# Patient Record
Sex: Female | Born: 1995 | Race: White | Hispanic: No | Marital: Single | Smoking: Former smoker
Health system: Southern US, Community
[De-identification: ages and names within clinical notes are randomized; demographics above are authoritative.]

## PROBLEM LIST (undated history)

## (undated) DIAGNOSIS — F419 Anxiety disorder, unspecified: Secondary | ICD-10-CM

## (undated) DIAGNOSIS — G43909 Migraine, unspecified, not intractable, without status migrainosus: Secondary | ICD-10-CM

## (undated) HISTORY — PX: FRENULECTOMY, LINGUAL: SHX1681

## (undated) HISTORY — PX: BREAST SURGERY: SHX581

---
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
## 2014-01-31 ENCOUNTER — Encounter: Payer: Self-pay | Admitting: Family Medicine

## 2014-01-31 ENCOUNTER — Other Ambulatory Visit: Payer: Self-pay | Admitting: Family Medicine

## 2014-01-31 ENCOUNTER — Ambulatory Visit (INDEPENDENT_AMBULATORY_CARE_PROVIDER_SITE_OTHER): Payer: BC Managed Care – PPO | Admitting: Family Medicine

## 2014-01-31 VITALS — BP 117/76 | HR 83 | Ht 64.0 in | Wt 118.0 lb

## 2014-01-31 DIAGNOSIS — Z113 Encounter for screening for infections with a predominantly sexual mode of transmission: Secondary | ICD-10-CM

## 2014-01-31 DIAGNOSIS — Z00129 Encounter for routine child health examination without abnormal findings: Secondary | ICD-10-CM

## 2014-01-31 DIAGNOSIS — N63 Unspecified lump in unspecified breast: Secondary | ICD-10-CM

## 2014-01-31 DIAGNOSIS — Z0000 Encounter for general adult medical examination without abnormal findings: Secondary | ICD-10-CM

## 2014-01-31 DIAGNOSIS — Z23 Encounter for immunization: Secondary | ICD-10-CM

## 2014-01-31 NOTE — Progress Notes (Signed)
  Subjective:     History was provided by the self.  Rita Graves is a 18 y.o. female who is here for this wellness visit.   Current Issues: Current concerns include:None  H (Home) Family Relationships: good Communication: good with parents Responsibilities: has responsibilities at home  E (Education): Grades: As and Bs School: [employer], freshman in college  Future Plans: college  A (Activities) Sports: no sports Exercise: Yes  Activities: working part time as well Friends: Yes   A (Auton/Safety) Auto: wears seat belt Bike: does not ride Safety: can swim  D (Diet) Diet: balanced diet Risky eating habits: none Intake: adequate iron and calcium intake Body Image: positive body image  Drugs Tobacco: Yes, occ Alcohol: No Drugs: No  Sex Activity: safe sex  Suicide Risk Emotions: healthy Depression: denies feelings of depression Suicidal: denies suicidal ideation     Objective:     Filed Vitals:   01/31/14 1059  BP: 117/76  Pulse: 83  Height: 5\' 4"  (1.626 m)  Weight: 118 lb (53.524 kg)   Growth parameters are noted and are appropriate for age.  General:   alert, cooperative and appears stated age  Gait:   normal  Skin:   normal  Oral cavity:   lips, mucosa, and tongue normal; teeth and gums normal  Eyes:   sclerae white, pupils equal and reactive  Ears:   normal bilaterally  Neck:   normal  Lungs:  clear to auscultation bilaterally  Heart:   regular rate and rhythm, S1, S2 normal, no murmur, click, rub or gallop  Abdomen:  soft, non-tender; bowel sounds normal; no masses,  no organomegaly  GU:  not examined  Extremities:   extremities normal, atraumatic, no cyanosis or edema  Neuro:  normal without focal findings, mental status, speech normal, alert and oriented x3, PERLA and reflexes normal and symmetric     Breast exam - normal left breast. On the right breast at the 10:00 position there is approximately 1 x 3 cm oval-shaped firm but mobile  nodule right at the edge of the area low. Just distal to this again at the 10:00 position on the right breast there is a 4 x 6 cm firm but mobile nodule.  Assessment:    Healthy 18 y.o. female child.    Plan:   1. Anticipatory guidance discussed. Nutrition, Physical activity, Behavior, Safety and Handout given  2. Follow-up visit in 12 months for next wellness visit, or sooner as needed.   3. Given Gardasil vaccine IM today.   4. Breast lump-refer for ultrasound. She had an ultrasound about a year ago and was told that the lesion in her breast was benign but she does like it's gotten larger so I think it's worth repeating the ultrasound this year.  5. Declines flu vaccine this year.   6. She is sexually active. Will do urine GC and chlaymdia.

## 2014-01-31 NOTE — Addendum Note (Signed)
Addended by: Teddy Spike on: 01/31/2014 12:31 PM   Modules accepted: Orders

## 2014-01-31 NOTE — Patient Instructions (Signed)
Keep up a regular exercise program and make sure you are eating a healthy diet Try to eat 4 servings of dairy a day, or if you are lactose intolerant take a calcium with vitamin D daily.  Your vaccines are up to date.   

## 2014-02-01 LAB — GC/CHLAMYDIA PROBE AMP
CT Probe RNA: NEGATIVE
GC PROBE AMP APTIMA: NEGATIVE

## 2014-02-03 NOTE — Addendum Note (Signed)
Addended by: Narda Rutherford on: 02/03/2014 03:31 PM   Modules accepted: Orders

## 2014-02-21 ENCOUNTER — Other Ambulatory Visit: Payer: Self-pay | Admitting: *Deleted

## 2014-02-21 DIAGNOSIS — N6019 Diffuse cystic mastopathy of unspecified breast: Secondary | ICD-10-CM

## 2014-02-24 ENCOUNTER — Other Ambulatory Visit: Payer: Self-pay | Admitting: Family Medicine

## 2014-02-24 DIAGNOSIS — N63 Unspecified lump in unspecified breast: Secondary | ICD-10-CM

## 2014-03-07 ENCOUNTER — Ambulatory Visit: Payer: BC Managed Care – PPO | Admitting: Family Medicine

## 2014-03-09 ENCOUNTER — Other Ambulatory Visit: Payer: Self-pay | Admitting: Family Medicine

## 2014-03-18 ENCOUNTER — Ambulatory Visit: Payer: BC Managed Care – PPO | Admitting: Family Medicine

## 2014-03-21 ENCOUNTER — Ambulatory Visit: Payer: BC Managed Care – PPO | Admitting: Family Medicine

## 2014-03-24 ENCOUNTER — Encounter: Payer: Self-pay | Admitting: Family Medicine

## 2014-03-24 ENCOUNTER — Ambulatory Visit (INDEPENDENT_AMBULATORY_CARE_PROVIDER_SITE_OTHER): Payer: BC Managed Care – PPO | Admitting: Family Medicine

## 2014-03-24 VITALS — BP 117/72 | HR 91 | Ht 64.0 in | Wt 127.0 lb

## 2014-03-24 DIAGNOSIS — B354 Tinea corporis: Secondary | ICD-10-CM

## 2014-03-24 DIAGNOSIS — R21 Rash and other nonspecific skin eruption: Secondary | ICD-10-CM

## 2014-03-24 MED ORDER — CLOTRIMAZOLE 1 % EX CREA: 1.0000 "application " | TOPICAL_CREAM | Freq: Two times a day (BID) | CUTANEOUS | Status: DC

## 2014-03-24 NOTE — Progress Notes (Signed)
   Subjective:    Patient ID: Rita Graves, female    DOB: Nov 01, 1995, 18 y.o.   MRN: [id number]  HPI Thinks may have ringworm on the right inner thigh. She noticed it a couple of weeks ago it is mildly itchy. If not any treatments. She's not had any animals but has been around some friends who do have animals.   she also noticed some welts on her left buttock cheek about a week ago. [place] Thought maybe it was a mosquito bite Judithe Modest. They were extremely itchy. They seem to be healing on her own.  Review of Systems     Objective:   Physical Exam Right inner thigh has an approx 4 cm circular region, classic for ringworm. She has a smaller erythematous 1 cm lesion is lateral to this.  The area on her left buttocks appears to be healing. Just some dry scaling skin.       Assessment & Plan:  Ringworm-gave reassurance. We'll treat with clotrimazole cream. Asked her to treat for a full week after the lesion clears.  The lesions on left buttock cheek appeared to be healing well. Unclear if this was from an insect bite. Certainly if it recurs in the exact same area then consider other diagnoses such as herpes simplex.

## 2014-03-24 NOTE — Addendum Note (Signed)
Addended by: Teddy Spike on: 03/24/2014 11:06 AM   Modules accepted: Orders

## 2014-03-24 NOTE — Patient Instructions (Signed)
Body Ringworm Ringworm (tinea corporis) is a fungal infection of the skin on the body. This infection is not caused by worms, but is actually caused by a fungus. Fungus normally lives on the top of your skin and can be useful. However, in the case of ringworms, the fungus grows out of control and causes a skin infection. It can involve any area of skin on the body and can spread easily from one person to another (contagious). Ringworm is a common problem for children, but it can affect adults as well. Ringworm is also often found in athletes, especially wrestlers who share equipment and mats.  CAUSES  Ringworm of the body is caused by a fungus called dermatophyte. It can spread by:  Touchingother people who are infected.  Touchinginfected pets.  Touching or sharingobjects that have been in contact with the infected person or pet (hats, combs, towels, clothing, sports equipment). SYMPTOMS   Itchy, raised red spots and bumps on the skin.  Ring-shaped rash.  Redness near the border of the rash with a clear center.  Dry and scaly skin on or around the rash. Not every person develops a ring-shaped rash. Some develop only the red, scaly patches. DIAGNOSIS  Most often, ringworm can be diagnosed by performing a skin exam. Your caregiver may choose to take a skin scraping from the affected area. The sample will be examined under the microscope to see if the fungus is present.  TREATMENT  Body ringworm may be treated with a topical antifungal cream or ointment. Sometimes, an antifungal shampoo that can be used on your body is prescribed. You may be prescribed antifungal medicines to take by mouth if your ringworm is severe, keeps coming back, or lasts a long time.  HOME CARE INSTRUCTIONS   Only take over-the-counter or prescription medicines as directed by your caregiver.  Wash the infected area and dry it completely before applying yourcream or ointment.  When using antifungal shampoo to  treat the ringworm, leave the shampoo on the body for 3-5 minutes before rinsing.   Wear loose clothing to stop clothes from rubbing and irritating the rash.  Wash or change your bed sheets every night while you have the rash.  Have your pet treated by your veterinarian if it has the same infection. To prevent ringworm:   Practice good hygiene.  Wear sandals or shoes in public places and showers.  Do not share personal items with others.  Avoid touching red patches of skin on other people.  Avoid touching pets that have bald spots or wash your hands after doing so. SEEK MEDICAL CARE IF:   Your rash continues to spread after 7 days of treatment.  Your rash is not gone in 4 weeks.  The area around your rash becomes red, warm, tender, and swollen. Document Released: 05/20/2000 Document Revised: 02/15/2012 Document Reviewed: 12/05/2011 [facility] Patient Information 2015 [place], [place]. This information is not intended to replace advice given to you by your health care provider. Make sure you discuss any questions you have with your health care provider. Clotrimazole skin cream, lotion, or solution What is this medicine? CLOTRIMAZOLE (kloe TRIM a zole) is an antifungal medicine. It is used to treat certain kinds of fungal or yeast infections of the skin. This medicine may be used for other purposes; ask your health care provider or pharmacist if you have questions. COMMON BRAND NAME(S): Anti-Fungal, Antifungal, Cruex, Desenex, Fungoid, Lotrimin, Lotrimin AF, Lotrimin AF Ringworm What should I tell my health care provider before  I take this medicine? They need to know if you have any of these conditions: -an unusual or allergic reaction to clotrimazole, other antifungals or medicines, foods, dyes or preservatives -pregnant or trying to get pregnant -breast-feeding How should I use this medicine? This medicine is for external use only. Do not take by mouth. Follow the directions on  the prescription label. Wash your hands before and after use. If treating a hand or nail infection, wash hands before use only. Apply a thin layer to the affected area and a small amount to the surrounding area. Rub in gently. Do not get this medicine in your eyes. If you do, rinse out with plenty of cool tap water. Use this medicine at regular intervals. Do not use more often than directed. Finish the full course prescribed by your doctor or health care professional even if you think you are better. Do not stop using except on your doctor's advice. Talk to your pediatrician regarding the use of this medicine in children. While this drug has been used in young children for selected conditions, precautions do apply. Overdosage: If you think you have taken too much of this medicine contact a poison control center or emergency room at once. NOTE: This medicine is only for you. Do not share this medicine with others. What if I miss a dose? If you miss a dose, use it as soon as you can. If it is almost time for your next dose, use only that dose. Do not use double or extra doses. What may interact with this medicine? -amphotericin b -topical products that have nystatin This list may not describe all possible interactions. Give your health care provider a list of all the medicines, herbs, non-prescription drugs, or dietary supplements you use. Also tell them if you smoke, drink alcohol, or use illegal drugs. Some items may interact with your medicine. What should I watch for while using this medicine? Tell your doctor or health care professional if your symptoms do not start to improve after 7 days. Do not self-medicate for more than one week. If you are using this medicine for 'jock itch' be sure to dry the groin completely after bathing. Do not wear underwear that is tight-fitting or made from synthetic fibers like rayon or nylon. Wear loose-fitting, cotton underwear. If you are using this medicine for  athlete's foot be sure to dry your feet carefully after bathing, especially between the toes. Do not wear socks made from wool or synthetic materials like rayon or nylon. Wear clean cotton socks and change them at least once a day, change them more if your feet sweat a lot. Also, try to wear sandals or shoes that are well-ventilated. What side effects may I notice from receiving this medicine? Side effects that usually do not require medical attention (report to your doctor or health care professional if they continue or are bothersome): -allergic reactions like skin rash, itching or hives, swelling of the face, lips, or tongue -skin irritation, burning This list may not describe all possible side effects. Call your doctor for medical advice about side effects. You may report side effects to FDA at 1-800-FDA-1088. Where should I keep my medicine? Keep out of the reach of children. Store at room temperature between 2 to 30 degrees C (36 to 86 degrees F). Do not freeze. Throw away any unused medicine after the expiration date. NOTE: This sheet is a summary. It may not cover all possible information. If you have questions about this medicine,  talk to your doctor, pharmacist, or health care provider.  2015, Elsevier/Gold Standard. (2007-08-27 16:53:51)

## 2014-03-25 LAB — KOH PREP: RESULT - KOH: NONE SEEN

## 2014-04-02 ENCOUNTER — Encounter: Payer: Self-pay | Admitting: Family Medicine

## 2014-04-02 ENCOUNTER — Ambulatory Visit: Payer: BC Managed Care – PPO

## 2014-04-08 ENCOUNTER — Emergency Department (INDEPENDENT_AMBULATORY_CARE_PROVIDER_SITE_OTHER)
Admission: EM | Admit: 2014-04-08 | Discharge: 2014-04-08 | Disposition: A | Discharge status: Home / Self Care | Payer: BC Managed Care – PPO | Source: Home / Self Care | Attending: Emergency Medicine | Admitting: Emergency Medicine

## 2014-04-08 ENCOUNTER — Encounter: Payer: Self-pay | Admitting: *Deleted

## 2014-04-08 DIAGNOSIS — N912 Amenorrhea, unspecified: Secondary | ICD-10-CM

## 2014-04-08 DIAGNOSIS — G43101 Migraine with aura, not intractable, with status migrainosus: Secondary | ICD-10-CM

## 2014-04-08 HISTORY — DX: Anxiety disorder, unspecified: F41.9

## 2014-04-08 HISTORY — DX: Migraine, unspecified, not intractable, without status migrainosus: G43.909

## 2014-04-08 LAB — POCT URINE PREGNANCY: Preg Test, Ur: NEGATIVE

## 2014-04-08 MED ORDER — HYDROCODONE-ACETAMINOPHEN 5-325 MG PO TABS: 1.0000 | ORAL_TABLET | ORAL | Status: DC | PRN

## 2014-04-08 MED ORDER — KETOROLAC TROMETHAMINE 60 MG/2ML IM SOLN
60.0000 mg | Freq: Once | INTRAMUSCULAR | Status: AC
  Administered 2014-04-08: 60 mg via INTRAMUSCULAR

## 2014-04-08 MED ORDER — METHYLPREDNISOLONE SODIUM SUCC 125 MG IJ SOLR
125.0000 mg | INTRAMUSCULAR | Status: AC
  Administered 2014-04-08: 125 mg via INTRAMUSCULAR

## 2014-04-08 NOTE — ED Notes (Signed)
Rita Graves reports having Aura with blurry vision followed by a migraine x 2 days. She took Imitrex 2 days ago, did not like the way it made her feel. she reports it helped the migraine a little. She reports that her wisdom teeth are coming in and she is not sure if this is related. She has had migraines x 2 months. She has not seen a neurologist.

## 2014-04-08 NOTE — ED Provider Notes (Signed)
CSN: [id number]     Arrival date & time 04/08/14  1230 History   First MD Initiated Contact with Patient 04/08/14 1308     Chief Complaint  Patient presents with  . Migraine  . Blurred Vision   (Consider location/radiation/quality/duration/timing/severity/associated sxs/prior Treatment) HPI Iris reports having Aura with blurry vision followed by a migraine x 2 days.  Right parietal/occipital headache is sharp, without radiation. 5/10 intensity. Sleep helps somewhat. She tried her father's oral Imitrex, it may have helped minimally, however she did not like the way it made her feel, as she felt the Imitrex made her feel weak and shaky.  she reports Tylenol/caffeine OTC helped the migraine a little. She reports that her wisdom teeth are coming in and she is not sure if this is related.  She states that she thinks that she's had migraines for three months or more, but has never been evaluated by a neurologist for this. She feels it's a migraine, as it's similar to migraines that her father has. She denies any focal neurologic symptoms other than occasional blurry vision. Denies double vision. Denies any other ENT symptoms. No fever or chills or nausea or vomiting. She denies any abdominal or GYN symptoms, and I questioned her about last menstrual period was approximately October 6. She states she is on an oral contraceptive, but she has had unprotected sex, and she requests that we do a urine pregnancy test today.  Past Medical History  Diagnosis Date  . Anxiety   . Migraine    Past Surgical History  Procedure Laterality Date  . Frenulectomy, lingual    . Breast surgery Right     biopsy   Family History  Problem Relation Age of Onset  . Diabetes Father   . Migraines Father   . Hypertension    . Thyroid disease    . Asthma     History  Substance Use Topics  . Smoking status: Current Every Day Smoker -- 0.50 packs/day  . Smokeless tobacco: Not on file  . Alcohol Use: No   OB  History    No data available     Review of Systems  Eyes: Positive for photophobia.  Neurological: Positive for headaches. Negative for syncope and numbness.  All other systems reviewed and are negative.   Allergies  Review of patient's allergies indicates no known allergies.  Home Medications   Prior to Admission medications   Medication Sig Start Date End Date Taking? Authorizing Provider  clonazePAM (KLONOPIN) 0.5 MG tablet Take 0.5 mg by mouth 2 (two) times daily as needed for anxiety.   Yes Historical Provider, MD  ALPRAZolam Duanne Moron) 0.5 MG tablet Take 1 tablet (0.5 mg total) by mouth 2 (two) times daily as needed for anxiety. 11/28/13   Hali Marry, MD  cetirizine (ZYRTEC) 10 MG tablet Take 10 mg by mouth daily.    Historical Provider, MD  citalopram (CELEXA) 40 MG tablet TAKE 1 TABLET (40 MG TOTAL) BY MOUTH DAILY. 12/26/13   Hali Marry, MD  clotrimazole (LOTRIMIN) 1 % cream Apply 1 application topically 2 (two) times daily. 03/24/14   Hali Marry, MD  desogestrel-ethinyl estradiol (KARIVA,AZURETTE,MIRCETTE) 0.15-0.02/0.01 MG (21/5) tablet Take 1 tablet by mouth daily. 12/26/13   Hali Marry, MD  HYDROcodone-acetaminophen (NORCO/VICODIN) 5-325 MG per tablet Take 1-2 tablets by mouth every 4 (four) hours as needed for severe pain. Take with food. 04/08/14   Jacqulyn Cane, MD   BP 108/69 mmHg  Pulse 94  Temp(Src) 99 F (37.2 C) (Oral)  Resp 16  Ht 5\' 3"  (1.6 m)  Wt 128 lb (58.06 kg)  BMI 22.68 kg/m2  SpO2 99%  LMP 03/11/2014 Physical Exam  Constitutional: She appears well-developed and well-nourished. She appears distressed (Uncomfortable from headache.).  +photophobic  HENT:  Head: Normocephalic and atraumatic. Head is without raccoon's eyes, without contusion, without right periorbital erythema and without left periorbital erythema.  Right Ear: External ear and ear canal normal. No drainage. Tympanic membrane is not erythematous.  Left  Ear: External ear and ear canal normal. No drainage. Tympanic membrane is not erythematous.  Nose: Nose normal. Right sinus exhibits no maxillary sinus tenderness. Left sinus exhibits no maxillary sinus tenderness.  Mouth/Throat: Oropharynx is clear and moist and mucous membranes are normal. No oral lesions. No dental abscesses. No oropharyngeal exudate, posterior oropharyngeal edema or posterior oropharyngeal erythema.      No cranial bruits.  Normal temporal arteries.  Eyes: Conjunctivae, EOM and lids are normal. Pupils are equal, round, and reactive to light.  Fundoscopic exam:      The right eye shows no exudate, no hemorrhage and no papilledema.       The left eye shows no exudate, no hemorrhage and no papilledema.  Neck: Neck supple. Normal carotid pulses and no JVD present. Carotid bruit is not present. No tracheal deviation present. No thyromegaly present.  Cardiovascular: Regular rhythm and normal heart sounds.   Pulmonary/Chest: Effort normal and breath sounds normal. No respiratory distress. She has no wheezes. She has no rales.  Abdominal: Soft. There is no tenderness.  Musculoskeletal: She exhibits no edema or tenderness.  Neurological: She is alert. She has normal strength and normal reflexes. No cranial nerve deficit or sensory deficit. Coordination normal.  Reflex Scores:      Tricep reflexes are 2+ on the right side and 2+ on the left side.      Bicep reflexes are 2+ on the right side and 2+ on the left side.      Brachioradialis reflexes are 2+ on the right side and 2+ on the left side.      Patellar reflexes are 2+ on the right side and 2+ on the left side.      Achilles reflexes are 2+ on the right side and 2+ on the left side. Skin: Skin is warm and dry. No rash noted.  Psychiatric: She has a normal mood and affect.  Nursing note and vitals reviewed.  ENT exam otherwise negative without sign of infection. She does have erupting wisdom teeth. No sign of dental  abscess. No signs of acute sinusitis ED Course  Procedures (including critical care time) Labs Review Labs Reviewed  POCT URINE PREGNANCY   Results for orders placed or performed during the hospital encounter of 04/08/14  POCT urine pregnancy  Result Value Ref Range   Preg Test, Ur Negative   I informed patient of negative urine pregnancy test.   Imaging Review No results found.   MDM   1. Migraine with aura and with status migrainosus, not intractable   2. Amenorrhea    Over 30 minutes spent, greater than 50% of the time spent for counseling and coordination of care. Discussed migraines. Explained there's no evidence clinically of any acute intracranial event. She declined any imaging tests today. Treatment options discussed, as well as risks, benefits, alternatives. Patient voiced understanding and agreement with the following plans: Toradol 60 mg IM SoluMedrol 125 mg IM Follow up with dentist this week  regarding her erupting wisdom teeth. Follow-up with neurologist within 7 days, ER if symptoms become worse or any red flag. Precautions discussed. Red flags discussed. Questions invited and answered. Patient voiced understanding and agreement.      Jacqulyn Cane, MD 04/08/14 [phone]

## 2014-04-28 ENCOUNTER — Ambulatory Visit: Payer: BC Managed Care – PPO | Admitting: Family Medicine

## 2014-04-28 DIAGNOSIS — Z0289 Encounter for other administrative examinations: Secondary | ICD-10-CM

## 2014-05-22 ENCOUNTER — Other Ambulatory Visit: Payer: Self-pay | Admitting: *Deleted

## 2014-05-22 MED ORDER — ALPRAZOLAM 0.5 MG PO TABS: 0.5000 mg | ORAL_TABLET | Freq: Two times a day (BID) | ORAL | Status: DC | PRN

## 2014-06-02 ENCOUNTER — Ambulatory Visit: Payer: BC Managed Care – PPO | Admitting: Family Medicine

## 2014-06-02 DIAGNOSIS — Z0289 Encounter for other administrative examinations: Secondary | ICD-10-CM

## 2014-06-09 ENCOUNTER — Ambulatory Visit: Payer: BC Managed Care – PPO | Admitting: Family Medicine

## 2014-06-13 ENCOUNTER — Encounter: Payer: Self-pay | Admitting: Family Medicine

## 2014-06-13 ENCOUNTER — Ambulatory Visit (INDEPENDENT_AMBULATORY_CARE_PROVIDER_SITE_OTHER): Payer: BLUE CROSS/BLUE SHIELD

## 2014-06-13 ENCOUNTER — Other Ambulatory Visit: Payer: Self-pay | Admitting: Family Medicine

## 2014-06-13 ENCOUNTER — Ambulatory Visit (INDEPENDENT_AMBULATORY_CARE_PROVIDER_SITE_OTHER): Payer: BLUE CROSS/BLUE SHIELD | Admitting: Family Medicine

## 2014-06-13 VITALS — BP 120/70 | HR 94 | Ht 64.0 in | Wt 136.0 lb

## 2014-06-13 DIAGNOSIS — M545 Low back pain, unspecified: Secondary | ICD-10-CM

## 2014-06-13 DIAGNOSIS — R52 Pain, unspecified: Secondary | ICD-10-CM

## 2014-06-13 DIAGNOSIS — M549 Dorsalgia, unspecified: Secondary | ICD-10-CM

## 2014-06-13 DIAGNOSIS — R638 Other symptoms and signs concerning food and fluid intake: Secondary | ICD-10-CM

## 2014-06-13 DIAGNOSIS — G9589 Other specified diseases of spinal cord: Secondary | ICD-10-CM

## 2014-06-13 DIAGNOSIS — F3281 Premenstrual dysphoric disorder: Secondary | ICD-10-CM

## 2014-06-13 DIAGNOSIS — N943 Premenstrual tension syndrome: Secondary | ICD-10-CM

## 2014-06-13 DIAGNOSIS — M542 Cervicalgia: Secondary | ICD-10-CM

## 2014-06-13 DIAGNOSIS — M546 Pain in thoracic spine: Secondary | ICD-10-CM

## 2014-06-13 MED ORDER — MELOXICAM 15 MG PO TABS: 15.0000 mg | ORAL_TABLET | Freq: Every day | ORAL | Status: DC

## 2014-06-13 MED ORDER — NORETHINDRON-ETHINYL ESTRAD-FE 1-20/1-30/1-35 MG-MCG PO TABS: 1.0000 | ORAL_TABLET | Freq: Every day | ORAL | Status: DC

## 2014-06-13 NOTE — Progress Notes (Signed)
   Subjective:    Patient ID: Rita Graves, female    DOB: Sep 22, 1995, 19 y.o.   MRN: [id number]  HPI  she is here to follow-up on low back pain that she has had for 6 months. She was doing more heavy lifting at work over the summer.. She reports that it's mostly in the center of her back and occasionally radiates up. She rates her pain is 5 out of 10. She's been doing stretching, taking hot showers, and using ibuprofen and heat pad. She would describe her pain as a dull ache. She was given hydrocodone for migraine and says it did seem to provide some relief for her back pain. She stands for prolonged periods as she is a Scientist, water quality at [employer] and that seems to aggravate her symptoms. She feels like her pain is constant. She says a couple of months ago she slipped on the stair.  Wake up stiff in the AM.  Says says hunched over until feels loosening up. Takes aabout 1/2 hour to loosen up.    Craving of orange juice.    She wonders if this could be a sign of something like iron deficiency anemia. She says it's been going on intermittently for years.  Very emotional around her periods the last 3 cycles.   She's been on her current birth control pill for quite some time and says it works well for her to control her periods but she is not happy with the emotional lability over the last several months. It usually is focused around the time of her..  Review of Systems     Objective:   Physical Exam  Constitutional: She is oriented to person, place, and time. She appears well-developed and well-nourished.  HENT:  Head: Normocephalic and atraumatic.  Musculoskeletal:  Neck with normal flexion, extension, rotation right and left and side bending. Shoulders with normal range of motion. Strength in the upper and lower chin these is 5 out of 5 and symmetric. She does have some tenderness over the left upper thoracic paraspinous muscles. She does have some tenderness over the lower thoracic spine.  Nontender over the SI joints or paraspinous muscles over the lumbar spine.  Neurological: She is alert and oriented to person, place, and time.  Skin: Skin is warm and dry.  Psychiatric: She has a normal mood and affect.          Assessment & Plan:   chronic back pain 6 months of the thoracic and lumbar spine-concerning for other etiology such as ankylosing spondylitis. We'll check a sedimentation rate, CRP, CBC as well as an HLA-B27. We'll also get x-rays. Again is unusual for 19 year old without any significant trauma or injury to have persistent back pain. If everything looks pretty normal then we will put her into physical therapy. She can stop her ibuprofen and will switch her to meloxicam. Make sure take with water and stop the medial if any GI upset or irritation.  Craving orange juice-this is a little unusual but we will check her for anemia.  PMDD-we will try switching to a different birth control pill to see if this makes a difference. If it doesn't we may need to consider a low-dose SSRI around the time of her menstrual cycle. Also encouraged her to make sure that she's exercising, eating healthy, and getting plenty of rest at night.

## 2014-06-14 LAB — CBC WITH DIFFERENTIAL/PLATELET
BASOS ABS: 0 10*3/uL (ref 0.0–0.1)
Basophils Relative: 0 % (ref 0–1)
Eosinophils Absolute: 0.4 10*3/uL (ref 0.0–0.7)
Eosinophils Relative: 6 % — ABNORMAL HIGH (ref 0–5)
HCT: 37.8 % (ref 36.0–46.0)
Hemoglobin: 12.7 g/dL (ref 12.0–15.0)
LYMPHS PCT: 31 % (ref 12–46)
Lymphs Abs: 2.2 10*3/uL (ref 0.7–4.0)
MCH: 30.2 pg (ref 26.0–34.0)
MCHC: 33.6 g/dL (ref 30.0–36.0)
MCV: 90 fL (ref 78.0–100.0)
MONOS PCT: 12 % (ref 3–12)
MPV: 9.2 fL (ref 8.6–12.4)
Monocytes Absolute: 0.8 10*3/uL (ref 0.1–1.0)
NEUTROS ABS: 3.6 10*3/uL (ref 1.7–7.7)
Neutrophils Relative %: 51 % (ref 43–77)
PLATELETS: 267 10*3/uL (ref 150–400)
RBC: 4.2 MIL/uL (ref 3.87–5.11)
RDW: 12.3 % (ref 11.5–15.5)
WBC: 7 10*3/uL (ref 4.0–10.5)

## 2014-06-14 LAB — C-REACTIVE PROTEIN: CRP: <0.5 mg/dL (ref <0.60)

## 2014-06-14 LAB — SEDIMENTATION RATE: Sed Rate: 4 mm/hr (ref 0–22)

## 2014-06-14 LAB — FERRITIN: Ferritin: 23 ng/mL (ref 10–291)

## 2014-06-14 LAB — FOLATE

## 2014-06-14 LAB — VITAMIN B12: Vitamin B-12: 723 pg/mL (ref 211–911)

## 2014-06-17 LAB — HLA-B27 ANTIGEN: DNA RESULT: NOT DETECTED

## 2014-06-18 ENCOUNTER — Other Ambulatory Visit: Payer: Self-pay | Admitting: Family Medicine

## 2014-06-18 DIAGNOSIS — M545 Low back pain, unspecified: Secondary | ICD-10-CM

## 2014-06-18 DIAGNOSIS — M546 Pain in thoracic spine: Secondary | ICD-10-CM

## 2014-06-30 ENCOUNTER — Ambulatory Visit: Payer: BLUE CROSS/BLUE SHIELD | Admitting: Physical Therapy

## 2014-07-22 ENCOUNTER — Ambulatory Visit: Payer: BLUE CROSS/BLUE SHIELD | Admitting: Physical Therapy

## 2014-07-25 ENCOUNTER — Ambulatory Visit: Payer: BLUE CROSS/BLUE SHIELD | Admitting: Family Medicine

## 2014-08-04 ENCOUNTER — Ambulatory Visit: Payer: BLUE CROSS/BLUE SHIELD | Admitting: Family Medicine

## 2014-08-14 ENCOUNTER — Encounter: Payer: Self-pay | Admitting: Family Medicine

## 2014-08-14 ENCOUNTER — Ambulatory Visit (INDEPENDENT_AMBULATORY_CARE_PROVIDER_SITE_OTHER): Payer: Medicaid Other | Admitting: Family Medicine

## 2014-08-14 VITALS — BP 129/75 | HR 96 | Ht 64.01 in | Wt 136.0 lb

## 2014-08-14 DIAGNOSIS — M545 Low back pain, unspecified: Secondary | ICD-10-CM

## 2014-08-14 DIAGNOSIS — M549 Dorsalgia, unspecified: Secondary | ICD-10-CM | POA: Insufficient documentation

## 2014-08-14 DIAGNOSIS — F3281 Premenstrual dysphoric disorder: Secondary | ICD-10-CM | POA: Insufficient documentation

## 2014-08-14 DIAGNOSIS — N943 Premenstrual tension syndrome: Secondary | ICD-10-CM

## 2014-08-14 DIAGNOSIS — M546 Pain in thoracic spine: Secondary | ICD-10-CM | POA: Diagnosis not present

## 2014-08-14 DIAGNOSIS — D241 Benign neoplasm of right breast: Secondary | ICD-10-CM

## 2014-08-14 DIAGNOSIS — F411 Generalized anxiety disorder: Secondary | ICD-10-CM

## 2014-08-14 DIAGNOSIS — N6310 Unspecified lump in the right breast, unspecified quadrant: Secondary | ICD-10-CM

## 2014-08-14 DIAGNOSIS — N63 Unspecified lump in breast: Secondary | ICD-10-CM

## 2014-08-14 NOTE — Progress Notes (Addendum)
Subjective:    Patient ID: Rita Graves, female    DOB: 01/22/1996, 19 y.o.   MRN: [id number]  HPI  Follow-up upper back/lumbar spine pain-1 I last saw her in January, approximately 2 months ago she was having persistent back pain that was dull and achy. She had negative x-ray films as well as normal blood work to rule out autoimmune causes. Referred her to physical therapy.Meloxicam helped at first. She hasn't been able to go to PT bc she lost her job.   PMDD-we decided to switch her birth control 2 months ago just to see if this would help. Also on 40mg  of citalopram.    She says she has felt a new cyst on the right breast. It's little bit more medial compared to the one that we previously had ultrasounded and had biopsied.  Review of Systems  BP 129/75 mmHg  Pulse 96  Ht 5' 4.01" (1.626 m)  Wt 136 lb (61.689 kg)  BMI 23.33 kg/m2    No Known Allergies  Past Medical History  Diagnosis Date  . Anxiety   . Migraine     Past Surgical History  Procedure Laterality Date  . Frenulectomy, lingual    . Breast surgery Right     biopsy    History   Social History  . Marital Status: Single    Spouse Name: N/A  . Number of Children: N/A  . Years of Education: N/A   Occupational History  . Not on file.   Social History Main Topics  . Smoking status: Current Some Day Smoker -- 0.50 packs/day  . Smokeless tobacco: Not on file  . Alcohol Use: No  . Drug Use: No  . Sexual Activity: Not on file   Other Topics Concern  . Not on file   Social History Narrative   Currently in 11th grade.  Early college at [employer].      Family History  Problem Relation Age of Onset  . Diabetes Father   . Migraines Father   . Hypertension    . Thyroid disease    . Asthma      Outpatient Encounter Prescriptions as of 08/14/2014  Medication Sig  . ALPRAZolam (XANAX) 0.5 MG tablet Take 1 tablet (0.5 mg total) by mouth 2 (two) times daily as needed for anxiety.  . citalopram (CELEXA)  40 MG tablet TAKE 1 TABLET (40 MG TOTAL) BY MOUTH DAILY.  . meloxicam (MOBIC) 15 MG tablet Take 1 tablet (15 mg total) by mouth daily.  . norethindrone-ethinyl estradiol-iron (ESTROSTEP FE,TILIA FE,TRI-LEGEST FE) 1-20/1-30/1-35 MG-MCG tablet Take 1 tablet by mouth daily.  . clonazePAM (KLONOPIN) 0.5 MG tablet Take 0.5 mg by mouth 2 (two) times daily as needed for anxiety.          Objective:   Physical Exam  Pulmonary/Chest:    she has a new lesion that is approximately 2.5 x 2 cm in the right lower quadrant medial to the nipple in approximately 5:00 position. 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  Musculoskeletal:  mildy tender over the mid thoracic spine.  Neck with normal flexion, extension, rotation right and left, side bending. Child[place] with normal range of motion. Upper enlarged may strength is 5 out of 5 and patellar reflexes are 2+.          Assessment & Plan:  Upper back pain/lumbar Pain -she is not going to be able to afford physical therapy recommend  a trial of home PT. Will give her handout on some exercises and stretches and encouraged her to start a walking program. If she's not improving over the next month or 2 then consider formal physical therapy if at that time she's able to afford it.   PMDD - doing a little bit better on the new birth control pill but had a regular cycle. Encouraged her to stand for 1 more month. If cycle does not regulate by the end of the next pill pack then consider switching again.   Generalized anxiety disorder-Still on citalopram. She feels like overall it's working well. Her PHQ 9 score is 16 today.  GAD- 7 score of 16 today as well.   Breast cyst on the right breast-she has a new lesion that is approximately 2.5 x 2 cm in the right lower quadrant medial to the nipple in approximately 5:00 position. 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 Will call the breast clinic in [facility] to see where she is on track. She had a biopsy 6 months ago.

## 2014-08-14 NOTE — Addendum Note (Signed)
Addended by: Beatrice Lecher D on: 08/14/2014 05:46 PM   Modules accepted: Orders

## 2014-08-21 ENCOUNTER — Telehealth: Payer: Self-pay | Admitting: *Deleted

## 2014-08-21 DIAGNOSIS — D241 Benign neoplasm of right breast: Secondary | ICD-10-CM

## 2014-08-21 DIAGNOSIS — N63 Unspecified lump in unspecified breast: Secondary | ICD-10-CM

## 2014-08-21 NOTE — Telephone Encounter (Signed)
Order placed & faxed to [phone].Rita Graves, Rita Graves

## 2014-08-22 ENCOUNTER — Other Ambulatory Visit: Payer: Self-pay | Admitting: *Deleted

## 2014-08-22 DIAGNOSIS — D241 Benign neoplasm of right breast: Secondary | ICD-10-CM

## 2014-08-22 DIAGNOSIS — N63 Unspecified lump in unspecified breast: Secondary | ICD-10-CM

## 2014-08-25 ENCOUNTER — Ambulatory Visit (INDEPENDENT_AMBULATORY_CARE_PROVIDER_SITE_OTHER): Payer: Medicaid Other | Admitting: Family Medicine

## 2014-08-25 VITALS — BP 120/77 | HR 91 | Ht 63.0 in | Wt 140.0 lb

## 2014-08-25 DIAGNOSIS — Z23 Encounter for immunization: Secondary | ICD-10-CM

## 2014-08-25 NOTE — Progress Notes (Signed)
   Subjective:    Patient ID: Rita Graves, female    DOB: [birth year], 19 y.o.   MRN: [id number]  HPI Patient came in today for HPV Vaccination. Since last administered dose was on 01/31/14, the patient was informed she will need to restart the vaccination series. Verbalized understanding. Patient states she had no complications from last injection. No changes in her medications and no new allergy's.   Review of Systems     Objective:   Physical Exam        Assessment & Plan:  Patient tolerated injection well in Left Deltoid without complications. Patient advised to schedule her next injections for 2 months and 6 months from today. Verbalized understanding.

## 2014-08-27 ENCOUNTER — Encounter: Payer: Self-pay | Admitting: Family Medicine

## 2014-08-27 ENCOUNTER — Other Ambulatory Visit: Payer: Self-pay | Admitting: Family Medicine

## 2014-09-05 ENCOUNTER — Encounter: Payer: Self-pay | Admitting: Family Medicine

## 2014-09-06 ENCOUNTER — Other Ambulatory Visit: Payer: Self-pay | Admitting: Family Medicine

## 2014-10-01 ENCOUNTER — Other Ambulatory Visit: Payer: Self-pay | Admitting: Family Medicine

## 2014-10-07 ENCOUNTER — Ambulatory Visit (INDEPENDENT_AMBULATORY_CARE_PROVIDER_SITE_OTHER): Payer: Medicaid Other | Admitting: Physician Assistant

## 2014-10-07 ENCOUNTER — Encounter: Payer: Self-pay | Admitting: Physician Assistant

## 2014-10-07 VITALS — BP 129/78 | HR 97 | Ht 63.0 in | Wt 141.0 lb

## 2014-10-07 DIAGNOSIS — M545 Low back pain, unspecified: Secondary | ICD-10-CM

## 2014-10-07 DIAGNOSIS — M546 Pain in thoracic spine: Secondary | ICD-10-CM | POA: Diagnosis not present

## 2014-10-07 DIAGNOSIS — M549 Dorsalgia, unspecified: Secondary | ICD-10-CM

## 2014-10-07 MED ORDER — DICLOFENAC SODIUM 75 MG PO TBEC: 75.0000 mg | DELAYED_RELEASE_TABLET | Freq: Two times a day (BID) | ORAL | Status: DC

## 2014-10-07 MED ORDER — ORPHENADRINE CITRATE ER 100 MG PO TB12: 100.0000 mg | ORAL_TABLET | Freq: Two times a day (BID) | ORAL | Status: DC

## 2014-10-07 NOTE — Patient Instructions (Addendum)
Consider visit with Dr. Darene Lamer.    Low Back Sprain with Rehab  A sprain is an injury in which a ligament is torn. The ligaments of the lower back are vulnerable to sprains. However, they are strong and require great force to be injured. These ligaments are important for stabilizing the spinal column. Sprains are classified into three categories. Grade 1 sprains cause pain, but the tendon is not lengthened. Grade 2 sprains include a lengthened ligament, due to the ligament being stretched or partially ruptured. With grade 2 sprains there is still function, although the function may be decreased. Grade 3 sprains involve a complete tear of the tendon or muscle, and function is usually impaired. SYMPTOMS   Severe pain in the lower back.  Sometimes, a feeling of a "pop," "snap," or tear, at the time of injury.  Tenderness and sometimes swelling at the injury site.  Uncommonly, bruising (contusion) within 48 hours of injury.  Muscle spasms in the back. CAUSES  Low back sprains occur when a force is placed on the ligaments that is greater than they can handle. Common causes of injury include:  Performing a stressful act while off-balance.  Repetitive stressful activities that involve movement of the lower back.  Direct hit (trauma) to the lower back. RISK INCREASES WITH:  Contact sports (football, wrestling).  Collisions (major skiing accidents).  Sports that require throwing or lifting (baseball, weightlifting).  Sports involving twisting of the spine (gymnastics, diving, tennis, golf).  Poor strength and flexibility.  Inadequate protection.  Previous back injury or surgery (especially fusion). PREVENTION  Wear properly fitted and padded protective equipment.  Warm up and stretch properly before activity.  Allow for adequate recovery between workouts.  Maintain physical fitness:  Strength, flexibility, and endurance.  Cardiovascular fitness.  Maintain a healthy body  weight. PROGNOSIS  If treated properly, low back sprains usually heal with non-surgical treatment. The length of time for healing depends on the severity of the injury.  RELATED COMPLICATIONS   Recurring symptoms, resulting in a chronic problem.  Chronic inflammation and pain in the low back.  Delayed healing or resolution of symptoms, especially if activity is resumed too soon.  Prolonged impairment.  Unstable or arthritic joints of the low back. TREATMENT  Treatment first involves the use of ice and medicine, to reduce pain and inflammation. The use of strengthening and stretching exercises may help reduce pain with activity. These exercises may be performed at home or with a therapist. Severe injuries may require referral to a therapist for further evaluation and treatment, such as ultrasound. Your caregiver may advise that you wear a back brace or corset, to help reduce pain and discomfort. Often, prolonged bed rest results in greater harm then benefit. Corticosteroid injections may be recommended. However, these should be reserved for the most serious cases. It is important to avoid using your back when lifting objects. At night, sleep on your back on a firm mattress, with a pillow placed under your knees. If non-surgical treatment is unsuccessful, surgery may be needed.  MEDICATION   If pain medicine is needed, nonsteroidal anti-inflammatory medicines (aspirin and ibuprofen), or other minor pain relievers (acetaminophen), are often advised.  Do not take pain medicine for 7 days before surgery.  Prescription pain relievers may be given, if your caregiver thinks they are needed. Use only as directed and only as much as you need.  Ointments applied to the skin may be helpful.  Corticosteroid injections may be given by your caregiver. These injections  should be reserved for the most serious cases, because they may only be given a certain number of times. HEAT AND COLD  Cold treatment  (icing) should be applied for 10 to 15 minutes every 2 to 3 hours for inflammation and pain, and immediately after activity that aggravates your symptoms. Use ice packs or an ice massage.  Heat treatment may be used before performing stretching and strengthening activities prescribed by your caregiver, physical therapist, or athletic trainer. Use a heat pack or a warm water soak. SEEK MEDICAL CARE IF:   Symptoms get worse or do not improve in 2 to 4 weeks, despite treatment.  You develop numbness or weakness in either leg.  You lose bowel or bladder function.  Any of the following occur after surgery: fever, increased pain, swelling, redness, drainage of fluids, or bleeding in the affected area.  New, unexplained symptoms develop. (Drugs used in treatment may produce side effects.) EXERCISES  RANGE OF MOTION (ROM) AND STRETCHING EXERCISES - Low Back Sprain Most people with lower back pain will find that their symptoms get worse with excessive bending forward (flexion) or arching at the lower back (extension). The exercises that will help resolve your symptoms will focus on the opposite motion.  Your physician, physical therapist or athletic trainer will help you determine which exercises will be most helpful to resolve your lower back pain. Do not complete any exercises without first consulting with your caregiver. Discontinue any exercises which make your symptoms worse, until you speak to your caregiver. If you have pain, numbness or tingling which travels down into your buttocks, leg or foot, the goal of the therapy is for these symptoms to move closer to your back and eventually resolve. Sometimes, these leg symptoms will get better, but your lower back pain may worsen. This is often an indication of progress in your rehabilitation. Be very alert to any changes in your symptoms and the activities in which you participated in the 24 hours prior to the change. Sharing this information with your  caregiver will allow him or her to most efficiently treat your condition. These exercises may help you when beginning to rehabilitate your injury. Your symptoms may resolve with or without further involvement from your physician, physical therapist or athletic trainer. While completing these exercises, remember:   Restoring tissue flexibility helps normal motion to return to the joints. This allows healthier, less painful movement and activity.  An effective stretch should be held for at least 30 seconds.  A stretch should never be painful. You should only feel a gentle lengthening or release in the stretched tissue. FLEXION RANGE OF MOTION AND STRETCHING EXERCISES: STRETCH - Flexion, Single Knee to Chest   Lie on a firm bed or floor with both legs extended in front of you.  Keeping one leg in contact with the floor, bring your opposite knee to your chest. Hold your leg in place by either grabbing behind your thigh or at your knee.  Pull until you feel a gentle stretch in your low back. Hold __________ seconds.  Slowly release your grasp and repeat the exercise with the opposite side. Repeat __________ times. Complete this exercise __________ times per day.  STRETCH - Flexion, Double Knee to Chest  Lie on a firm bed or floor with both legs extended in front of you.  Keeping one leg in contact with the floor, bring your opposite knee to your chest.  Tense your stomach muscles to support your back and then lift your  other knee to your chest. Hold your legs in place by either grabbing behind your thighs or at your knees.  Pull both knees toward your chest until you feel a gentle stretch in your low back. Hold __________ seconds.  Tense your stomach muscles and slowly return one leg at a time to the floor. Repeat __________ times. Complete this exercise __________ times per day.  STRETCH - Low Trunk Rotation  Lie on a firm bed or floor. Keeping your legs in front of you, bend your knees  so they are both pointed toward the ceiling and your feet are flat on the floor.  Extend your arms out to the side. This will stabilize your upper body by keeping your shoulders in contact with the floor.  Gently and slowly drop both knees together to one side until you feel a gentle stretch in your low back. Hold for __________ seconds.  Tense your stomach muscles to support your lower back as you bring your knees back to the starting position. Repeat the exercise to the other side. Repeat __________ times. Complete this exercise __________ times per day  EXTENSION RANGE OF MOTION AND FLEXIBILITY EXERCISES: STRETCH - Extension, Prone on Elbows   Lie on your stomach on the floor, a bed will be too soft. Place your palms about shoulder width apart and at the height of your head.  Place your elbows under your shoulders. If this is too painful, stack pillows under your chest.  Allow your body to relax so that your hips drop lower and make contact more completely with the floor.  Hold this position for __________ seconds.  Slowly return to lying flat on the floor. Repeat __________ times. Complete this exercise __________ times per day.  RANGE OF MOTION - Extension, Prone Press Ups  Lie on your stomach on the floor, a bed will be too soft. Place your palms about shoulder width apart and at the height of your head.  Keeping your back as relaxed as possible, slowly straighten your elbows while keeping your hips on the floor. You may adjust the placement of your hands to maximize your comfort. As you gain motion, your hands will come more underneath your shoulders.  Hold this position __________ seconds.  Slowly return to lying flat on the floor. Repeat __________ times. Complete this exercise __________ times per day.  RANGE OF MOTION- Quadruped, Neutral Spine   Assume a hands and knees position on a firm surface. Keep your hands under your shoulders and your knees under your hips. You may  place padding under your knees for comfort.  Drop your head and point your tailbone toward the ground below you. This will round out your lower back like an angry cat. Hold this position for __________ seconds.  Slowly lift your head and release your tail bone so that your back sags into a large arch, like an old horse.  Hold this position for __________ seconds.  Repeat this until you feel limber in your low back.  Now, find your "sweet spot." This will be the most comfortable position somewhere between the two previous positions. This is your neutral spine. Once you have found this position, tense your stomach muscles to support your low back.  Hold this position for __________ seconds. Repeat __________ times. Complete this exercise __________ times per day.  STRENGTHENING EXERCISES - Low Back Sprain These exercises may help you when beginning to rehabilitate your injury. These exercises should be done near your "sweet spot." This is  the neutral, low-back arch, somewhere between fully rounded and fully arched, that is your least painful position. When performed in this safe range of motion, these exercises can be used for people who have either a flexion or extension based injury. These exercises may resolve your symptoms with or without further involvement from your physician, physical therapist or athletic trainer. While completing these exercises, remember:   Muscles can gain both the endurance and the strength needed for everyday activities through controlled exercises.  Complete these exercises as instructed by your physician, physical therapist or athletic trainer. Increase the resistance and repetitions only as guided.  You may experience muscle soreness or fatigue, but the pain or discomfort you are trying to eliminate should never worsen during these exercises. If this pain does worsen, stop and make certain you are following the directions exactly. If the pain is still present after  adjustments, discontinue the exercise until you can discuss the trouble with your caregiver. STRENGTHENING - Deep Abdominals, Pelvic Tilt   Lie on a firm bed or floor. Keeping your legs in front of you, bend your knees so they are both pointed toward the ceiling and your feet are flat on the floor.  Tense your lower abdominal muscles to press your low back into the floor. This motion will rotate your pelvis so that your tail bone is scooping upwards rather than pointing at your feet or into the floor. With a gentle tension and even breathing, hold this position for __________ seconds. Repeat __________ times. Complete this exercise __________ times per day.  STRENGTHENING - Abdominals, Crunches   Lie on a firm bed or floor. Keeping your legs in front of you, bend your knees so they are both pointed toward the ceiling and your feet are flat on the floor. Cross your arms over your chest.  Slightly tip your chin down without bending your neck.  Tense your abdominals and slowly lift your trunk high enough to just clear your shoulder blades. Lifting higher can put excessive stress on the lower back and does not further strengthen your abdominal muscles.  Control your return to the starting position. Repeat __________ times. Complete this exercise __________ times per day.  STRENGTHENING - Quadruped, Opposite UE/LE Lift   Assume a hands and knees position on a firm surface. Keep your hands under your shoulders and your knees under your hips. You may place padding under your knees for comfort.  Find your neutral spine and gently tense your abdominal muscles so that you can maintain this position. Your shoulders and hips should form a rectangle that is parallel with the floor and is not twisted.  Keeping your trunk steady, lift your right hand no higher than your shoulder and then your left leg no higher than your hip. Make sure you are not holding your breath. Hold this position for __________  seconds.  Continuing to keep your abdominal muscles tense and your back steady, slowly return to your starting position. Repeat with the opposite arm and leg. Repeat __________ times. Complete this exercise __________ times per day.  STRENGTHENING - Abdominals and Quadriceps, Straight Leg Raise   Lie on a firm bed or floor with both legs extended in front of you.  Keeping one leg in contact with the floor, bend the other knee so that your foot can rest flat on the floor.  Find your neutral spine, and tense your abdominal muscles to maintain your spinal position throughout the exercise.  Slowly lift your straight leg off  the floor about 6 inches for a count of 15, making sure to not hold your breath.  Still keeping your neutral spine, slowly lower your leg all the way to the floor. Repeat this exercise with each leg __________ times. Complete this exercise __________ times per day. POSTURE AND BODY MECHANICS CONSIDERATIONS - Low Back Sprain Keeping correct posture when sitting, standing or completing your activities will reduce the stress put on different body tissues, allowing injured tissues a chance to heal and limiting painful experiences. The following are general guidelines for improved posture. Your physician or physical therapist will provide you with any instructions specific to your needs. While reading these guidelines, remember:  The exercises prescribed by your provider will help you have the flexibility and strength to maintain correct postures.  The correct posture provides the best environment for your joints to work. All of your joints have less wear and tear when properly supported by a spine with good posture. This means you will experience a healthier, less painful body.  Correct posture must be practiced with all of your activities, especially prolonged sitting and standing. Correct posture is as important when doing repetitive low-stress activities (typing) as it is when  doing a single heavy-load activity (lifting). RESTING POSITIONS Consider which positions are most painful for you when choosing a resting position. If you have pain with flexion-based activities (sitting, bending, stooping, squatting), choose a position that allows you to rest in a less flexed posture. You would want to avoid curling into a fetal position on your side. If your pain worsens with extension-based activities (prolonged standing, working overhead), avoid resting in an extended position such as sleeping on your stomach. Most people will find more comfort when they rest with their spine in a more neutral position, neither too rounded nor too arched. Lying on a non-sagging bed on your side with a pillow between your knees, or on your back with a pillow under your knees will often provide some relief. Keep in mind, being in any one position for a prolonged period of time, no matter how correct your posture, can still lead to stiffness. PROPER SITTING POSTURE In order to minimize stress and discomfort on your spine, you must sit with correct posture. Sitting with good posture should be effortless for a healthy body. Returning to good posture is a gradual process. Many people can work toward this most comfortably by using various supports until they have the flexibility and strength to maintain this posture on their own. When sitting with proper posture, your ears will fall over your shoulders and your shoulders will fall over your hips. You should use the back of the chair to support your upper back. Your lower back will be in a neutral position, just slightly arched. You may place a small pillow or folded towel at the base of your lower back for  support.  When working at a desk, create an environment that supports good, upright posture. Without extra support, muscles tire, which leads to excessive strain on joints and other tissues. Keep these recommendations in mind: CHAIR:  A chair should be  able to slide under your desk when your back makes contact with the back of the chair. This allows you to work closely.  The chair's height should allow your eyes to be level with the upper part of your monitor and your hands to be slightly lower than your elbows. BODY POSITION  Your feet should make contact with the floor. If this is not  possible, use a foot rest.  Keep your ears over your shoulders. This will reduce stress on your neck and low back. INCORRECT SITTING POSTURES  If you are feeling tired and unable to assume a healthy sitting posture, do not slouch or slump. This puts excessive strain on your back tissues, causing more damage and pain. Healthier options include:  Using more support, like a lumbar pillow.  Switching tasks to something that requires you to be upright or walking.  Talking a brief walk.  Lying down to rest in a neutral-spine position. PROLONGED STANDING WHILE SLIGHTLY LEANING FORWARD  When completing a task that requires you to lean forward while standing in one place for a long time, place either foot up on a stationary 2-4 inch high object to help maintain the best posture. When both feet are on the ground, the lower back tends to lose its slight inward curve. If this curve flattens (or becomes too large), then the back and your other joints will experience too much stress, tire more quickly, and can cause pain. CORRECT STANDING POSTURES Proper standing posture should be assumed with all daily activities, even if they only take a few moments, like when brushing your teeth. As in sitting, your ears should fall over your shoulders and your shoulders should fall over your hips. You should keep a slight tension in your abdominal muscles to brace your spine. Your tailbone should point down to the ground, not behind your body, resulting in an over-extended swayback posture.  INCORRECT STANDING POSTURES  Common incorrect standing postures include a forward head, locked  knees and/or an excessive swayback. WALKING Walk with an upright posture. Your ears, shoulders and hips should all line-up. PROLONGED ACTIVITY IN A FLEXED POSITION When completing a task that requires you to bend forward at your waist or lean over a low surface, try to find a way to stabilize 3 out of 4 of your limbs. You can place a hand or elbow on your thigh or rest a knee on the surface you are reaching across. This will provide you more stability, so that your muscles do not tire as quickly. By keeping your knees relaxed, or slightly bent, you will also reduce stress across your lower back. CORRECT LIFTING TECHNIQUES DO :  Assume a wide stance. This will provide you more stability and the opportunity to get as close as possible to the object which you are lifting.  Tense your abdominals to brace your spine. Bend at the knees and hips. Keeping your back locked in a neutral-spine position, lift using your leg muscles. Lift with your legs, keeping your back straight.  Test the weight of unknown objects before attempting to lift them.  Try to keep your elbows locked down at your sides in order get the best strength from your shoulders when carrying an object.  Always ask for help when lifting heavy or awkward objects. INCORRECT LIFTING TECHNIQUES DO NOT:   Lock your knees when lifting, even if it is a small object.  Bend and twist. Pivot at your feet or move your feet when needing to change directions.  Assume that you can safely pick up even a paperclip without proper posture. Document Released: 05/23/2005 Document Revised: 08/15/2011 Document Reviewed: 09/04/2008 [facility] Patient Information 2015 [place], [place]. This information is not intended to replace advice given to you by your health care provider. Make sure you discuss any questions you have with your health care provider.

## 2014-10-09 ENCOUNTER — Encounter: Payer: Self-pay | Admitting: Family Medicine

## 2014-10-09 NOTE — Progress Notes (Signed)
   Subjective:    Patient ID: Rita Graves, female    DOB: 01-12-1996, 19 y.o.   MRN: [id number]  HPI Pt presents to the clinic with ongoing back pain. No new injuries she just wants to know the next steps. mobic helped at first but not helping now. She always feels uncomfortable. No radiation into legs. No saddle anthesthesia. Dr. Jerilynn Mages has done complete work up of back pain with no overt findings.    Review of Systems     Objective:   Physical Exam  Constitutional: She appears well-developed and well-nourished.  Cardiovascular: Normal rate, regular rhythm and normal heart sounds.   Musculoskeletal:  mildy tender over the mid thoracic spine. Neck with normal flexion, extension, rotation right and left, side bending.waist with normal range of motion. Upper and lower extremities strength is 5 out of 5 and patellar reflexes are 2+.   Psychiatric: She has a normal mood and affect. Her behavior is normal.          Assessment & Plan:  Bilateral back pain without sciatica/upper back pain- xrays have been done. Autoimmune labs done. Not able to afford PT. Discussed chiropractor. Pt is going to have to try something conservative. Did try a different NSAID diclofenac. Discussed SE's. Will also try a different muscle relaxer. Follow up with Dr. Darene Lamer if no improvement.

## 2014-10-27 ENCOUNTER — Ambulatory Visit (INDEPENDENT_AMBULATORY_CARE_PROVIDER_SITE_OTHER): Payer: BLUE CROSS/BLUE SHIELD | Admitting: Family Medicine

## 2014-10-27 ENCOUNTER — Encounter: Payer: Self-pay | Admitting: Family Medicine

## 2014-10-27 ENCOUNTER — Ambulatory Visit: Payer: Medicaid Other

## 2014-10-27 ENCOUNTER — Ambulatory Visit: Payer: Medicaid Other | Admitting: Family Medicine

## 2014-10-27 VITALS — BP 138/80 | HR 87 | Wt 149.0 lb

## 2014-10-27 DIAGNOSIS — N926 Irregular menstruation, unspecified: Secondary | ICD-10-CM

## 2014-10-27 DIAGNOSIS — R6889 Other general symptoms and signs: Secondary | ICD-10-CM

## 2014-10-27 DIAGNOSIS — Z789 Other specified health status: Secondary | ICD-10-CM

## 2014-10-27 LAB — POCT URINE PREGNANCY: Preg Test, Ur: POSITIVE

## 2014-10-27 NOTE — Progress Notes (Signed)
CC: Rita Graves is a 19 y.o. female is here for Possible Pregnancy   Subjective: HPI:  Reports last menstrual period occurred approximately 4 weeks ago at the regular scheduled date however she usually bleeds for 5 days and for some reason she had heavy flow on the first day and only bled for 2 additional days. She's had some unintentional weight gain since last summer but she's also noticed that over the past month she's had more weight in her abdomen and breasts. She is taking a birth control on daily basis and denies any complaints issues. She's had sex a few times over the last 2 months but she is adamant that there is almost no way she could be pregnant. She is quite concerned that she could be pregnant. No gastrointestinal or genitourinary complaints.  She believes that she is here also to get the second HPV vaccine. The last one she got was 2 months ago.  Review Of Systems Outlined In HPI  Past Medical History  Diagnosis Date  . Anxiety   . Migraine     Past Surgical History  Procedure Laterality Date  . Frenulectomy, lingual    . Breast surgery Right     biopsy   Family History  Problem Relation Age of Onset  . Diabetes Father   . Migraines Father   . Hypertension    . Thyroid disease    . Asthma      History   Social History  . Marital Status: Single    Spouse Name: N/A  . Number of Children: N/A  . Years of Education: N/A   Occupational History  . Not on file.   Social History Main Topics  . Smoking status: Current Some Day Smoker -- 0.50 packs/day  . Smokeless tobacco: Not on file  . Alcohol Use: No  . Drug Use: No  . Sexual Activity: Not on file   Other Topics Concern  . Not on file   Social History Narrative   Currently in 11th grade.  Early college at [employer].       Objective: BP 138/80 mmHg  Pulse 87  Wt 149 lb (67.586 kg)  Vital signs reviewed. General: Alert and Oriented, No Acute Distress HEENT: Pupils equal, round, reactive to  light. Conjunctivae clear.  External ears unremarkable.  Moist mucous membranes. Lungs: Clear and comfortable work of breathing, speaking in full sentences without accessory muscle use. Cardiac: Regular rate and rhythm.  Neuro: CN II-XII grossly intact, gait normal. Extremities: No peripheral edema.  Strong peripheral pulses.  Mental Status: No depression, anxiety, nor agitation. Logical though process. Skin: Warm and dry.  Assessment & Plan: Rita Graves was seen today for possible pregnancy.  Diagnoses and all orders for this visit:  Unintentional weight change Orders: -     POCT urine pregnancy -     TSH -     T4, free -     T3, free -     B-HCG Quant  Irregular menstrual cycle Orders: -     POCT urine pregnancy -     TSH -     T4, free -     T3, free -     B-HCG Quant   Urine pregnancy test was questionably positive therefore sent forquantitative beta hCG. Rule out thyroid abnormality given unintentional weigh tgain for the last year, unpredictable periods prior to using birth control pills and recent menstrual irregularity.   Discuss with her that my understanding is that she does not  need to restart the whole HPV vaccine regimen and that she would be due for her third and final shot on or after June21st 2016   Return if symptoms worsen or fail to improve.

## 2014-10-28 LAB — TSH: TSH: 1.597 u[IU]/mL (ref 0.350–4.500)

## 2014-10-28 LAB — T4, FREE: Free T4: 0.98 ng/dL (ref 0.80–1.80)

## 2014-10-28 LAB — T3, FREE: T3, Free: 2.8 pg/mL (ref 2.3–4.2)

## 2014-10-28 LAB — HCG, QUANTITATIVE, PREGNANCY: hCG, Beta Chain, Quant, S: <2 m[IU]/mL

## 2014-12-03 ENCOUNTER — Other Ambulatory Visit: Payer: Self-pay | Admitting: Family Medicine

## 2014-12-04 ENCOUNTER — Ambulatory Visit (INDEPENDENT_AMBULATORY_CARE_PROVIDER_SITE_OTHER): Payer: BLUE CROSS/BLUE SHIELD | Admitting: Sports Medicine

## 2014-12-04 ENCOUNTER — Other Ambulatory Visit: Payer: Self-pay

## 2014-12-04 VITALS — BP 125/73 | HR 83 | Wt 153.0 lb

## 2014-12-04 DIAGNOSIS — Z23 Encounter for immunization: Secondary | ICD-10-CM

## 2014-12-04 MED ORDER — CITALOPRAM HYDROBROMIDE 40 MG PO TABS: ORAL_TABLET | ORAL | Status: DC

## 2014-12-04 NOTE — Progress Notes (Signed)
   Subjective:    Patient ID: Rita Graves, female    DOB: [birth year], 19 y.o.   MRN: [id number]  HPI  Patient is here today for the third gardasil injection.   Review of Systems     Objective:   Physical Exam        Assessment & Plan:  Patient tolerated injection well.

## 2014-12-12 ENCOUNTER — Ambulatory Visit: Payer: BLUE CROSS/BLUE SHIELD | Admitting: Family Medicine

## 2014-12-17 ENCOUNTER — Encounter: Payer: Self-pay | Admitting: Family Medicine

## 2014-12-17 ENCOUNTER — Ambulatory Visit (INDEPENDENT_AMBULATORY_CARE_PROVIDER_SITE_OTHER): Payer: BLUE CROSS/BLUE SHIELD | Admitting: Family Medicine

## 2014-12-17 VITALS — BP 133/78 | HR 99 | Ht 63.01 in | Wt 153.0 lb

## 2014-12-17 DIAGNOSIS — Z113 Encounter for screening for infections with a predominantly sexual mode of transmission: Secondary | ICD-10-CM

## 2014-12-17 DIAGNOSIS — N943 Premenstrual tension syndrome: Secondary | ICD-10-CM | POA: Diagnosis not present

## 2014-12-17 DIAGNOSIS — F3281 Premenstrual dysphoric disorder: Secondary | ICD-10-CM

## 2014-12-17 MED ORDER — CITALOPRAM HYDROBROMIDE 10 MG PO TABS: ORAL_TABLET | ORAL | Status: DC

## 2014-12-17 NOTE — Progress Notes (Signed)
   Subjective:    Patient ID: Rita Graves, female    DOB: 27-Jan-1996, 19 y.o.   MRN: [id number]  HPI Patient is actually considering going into the [employer]. She's currently on citalopram 40 mg in the discussed with her that she would not be able to take any type of antidepressive medication for a year before she would be accepted into the [employer] area she would like to discuss that today. She is on the medication for PMDD. 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 .    She is on amoxicillin for a UTI and diflucan for a yeast infection. Went to [facility]. She would to get STD testing. She denies any known exposure but just wants to get tested.   Review of Systems     Objective:   Physical Exam  Constitutional: She is oriented to person, place, and time. She appears well-developed and well-nourished.  HENT:  Head: Normocephalic and atraumatic.  Eyes: Conjunctivae and EOM are normal.  Cardiovascular: Normal rate.   Pulmonary/Chest: Effort normal.  Neurological: She is alert and oriented to person, place, and time.  Skin: Skin is dry. No pallor.  Psychiatric: She has a normal mood and affect. Her behavior is normal.          Assessment & Plan:  PMDD - she's happy with her birth control which is helped some of her symptoms and she is ready to  wean the citalopram. Will wean over  2 weeks.  Please call if she starts feeling bad and he felt like the taper is a little too quick. And we can adjust it.  STD testing - we can do blood draw for RPR syphilis and HSV. Recommend urine for gonorrhea and Chlamydia. She went to do the blood work today but hold off on doing the urine.

## 2014-12-17 NOTE — Patient Instructions (Signed)
Decrease citalopram to half a tab for 7-10 days. Then start the 10 mg daily for 5-7 days and then can stop.

## 2014-12-18 LAB — HIV ANTIBODY (ROUTINE TESTING W REFLEX): HIV 1&2 Ab, 4th Generation: NONREACTIVE

## 2014-12-18 LAB — RPR

## 2014-12-19 LAB — HSV(HERPES SMPLX)ABS-I+II(IGG+IGM)-BLD
HERPES SIMPLEX VRS I-IGM AB (EIA): 1.19 {index} — AB
HSV 2 Glycoprotein G Ab, IgG: <0.1 IV

## 2014-12-22 ENCOUNTER — Ambulatory Visit: Payer: Medicaid Other | Admitting: Family Medicine

## 2014-12-30 ENCOUNTER — Encounter: Payer: Self-pay | Admitting: Family Medicine

## 2014-12-30 ENCOUNTER — Ambulatory Visit (INDEPENDENT_AMBULATORY_CARE_PROVIDER_SITE_OTHER): Payer: BLUE CROSS/BLUE SHIELD | Admitting: Family Medicine

## 2014-12-30 VITALS — BP 134/79 | HR 103 | Wt 153.0 lb

## 2014-12-30 DIAGNOSIS — N939 Abnormal uterine and vaginal bleeding, unspecified: Secondary | ICD-10-CM | POA: Diagnosis not present

## 2014-12-30 LAB — POCT URINE PREGNANCY: Preg Test, Ur: NEGATIVE

## 2014-12-30 MED ORDER — NORGESTIMATE-ETH ESTRADIOL 0.25-35 MG-MCG PO TABS: 1.0000 | ORAL_TABLET | Freq: Every day | ORAL | Status: DC

## 2014-12-30 NOTE — Assessment & Plan Note (Signed)
Heavy vaginal bleeding. I believe this is likely due to inadequate dosage of hormones with on the triphasic pill. Will increase to a larger dose monophasic pill. We'll proceed with further workup as needed if not better.

## 2014-12-30 NOTE — Progress Notes (Signed)
Rita Graves is a 19 y.o. female who presents to [facility]  today for a vaginal bleeding. 2 days ago patient developed sudden onset of heavy vaginal bleeding. This is 2 weeks earlier than it should've been. She uses a low-dose triphasic birth control pill. She notes that over the past several months she's been having spotting in between her cycles. She denies any significant abdominal pain fevers chills nausea vomiting or diarrhea. She feels well with no lightheadedness or dizziness.   Past Medical History  Diagnosis Date  . Anxiety   . Migraine    Past Surgical History  Procedure Laterality Date  . Frenulectomy, lingual    . Breast surgery Right     biopsy   History  Substance Use Topics  . Smoking status: Current Some Day Smoker -- 0.50 packs/day  . Smokeless tobacco: Not on file  . Alcohol Use: No   ROS as above Medications: Current Outpatient Prescriptions  Medication Sig Dispense Refill  . citalopram (CELEXA) 10 MG tablet TAKE 1 TABLET (40 MG TOTAL) BY MOUTH DAILY. 10 tablet 0  . diclofenac (VOLTAREN) 75 MG EC tablet Take 1 tablet (75 mg total) by mouth 2 (two) times daily. 60 tablet 2  . orphenadrine (NORFLEX) 100 MG tablet Take 1 tablet (100 mg total) by mouth 2 (two) times daily. 60 tablet 2  . norgestimate-ethinyl estradiol (SPRINTEC 28) 0.25-35 MG-MCG tablet Take 1 tablet by mouth daily. 1 Package 11   No current facility-administered medications for this visit.   No Known Allergies   Exam:  BP 134/79 mmHg  Pulse 103  Wt 153 lb (69.4 kg) Gen: Well NAD HEENT: EOMI,  MMM Lungs: Normal work of breathing. CTABL Heart: RRR no MRG Abd: NABS, Soft. Nondistended, Nontender Exts: Brisk capillary refill, warm and well perfused.   Results for orders placed or performed in visit on 12/30/14 (from the past 24 hour(s))  POCT urine pregnancy     Status: None   Collection Time: 12/30/14  2:59 PM  Result Value Ref Range   Preg Test,  Ur Negative Negative   No results found.   Please see individual assessment and plan sections.

## 2014-12-30 NOTE — Patient Instructions (Signed)
Thank you for coming in today. Take Sprintec daily.  Return as needed.  If your belly pain worsens, or you have high fever, bad vomiting, blood in your stool or black tarry stool go to the Emergency Room.   Menorrhagia Menorrhagia is the medical term for when your menstrual periods are heavy or last longer than usual. With menorrhagia, every period you have may cause enough blood loss and cramping that you are unable to maintain your usual activities. CAUSES  In some cases, the cause of heavy periods is unknown, but a number of conditions may cause menorrhagia. Common causes include:  A problem with the hormone-producing thyroid gland (hypothyroid).  Noncancerous growths in the uterus (polyps or fibroids).  An imbalance of the estrogen and progesterone hormones.  One of your ovaries not releasing an egg during one or more months.  Side effects of having an intrauterine device (IUD).  Side effects of some medicines, such as anti-inflammatory medicines or blood thinners.  A bleeding disorder that stops your blood from clotting normally. SIGNS AND SYMPTOMS  During a normal period, bleeding lasts between 4 and 8 days. Signs that your periods are too heavy include:  You routinely have to change your pad or tampon every 1 or 2 hours because it is completely soaked.  You pass blood clots larger than 1 inch (2.5 cm) in size.  You have bleeding for more than 7 days.  You need to use pads and tampons at the same time because of heavy bleeding.  You need to wake up to change your pads or tampons during the night.  You have symptoms of anemia, such as tiredness, fatigue, or shortness of breath. DIAGNOSIS  Your health care provider will perform a physical exam and ask you questions about your symptoms and menstrual history. Other tests may be ordered based on what the health care provider finds during the exam. These tests can include:  Blood tests. Blood tests are used to check if you  are pregnant or have hormonal changes, a bleeding or thyroid disorder, low iron levels (anemia), or other problems.  Endometrial biopsy. Your health care provider takes a sample of tissue from the inside of your uterus to be examined under a microscope.  Pelvic ultrasound. This test uses sound waves to make a picture of your uterus, ovaries, and vagina. The pictures can show if you have fibroids or other growths.  Hysteroscopy. For this test, your health care provider will use a small telescope to look inside your uterus. Based on the results of your initial tests, your health care provider may recommend further testing. TREATMENT  Treatment may not be needed. If it is needed, your health care provider may recommend treatment with one or more medicines first. If these do not reduce bleeding enough, a surgical treatment might be an option. The best treatment for you will depend on:   Whether you need to prevent pregnancy.  Your desire to have children in the future.  The cause and severity of your bleeding.  Your opinion and personal preference.  Medicines for menorrhagia may include:  Birth control methods that use hormones. These include the pill, skin patch, vaginal ring, shots that you get every 3 months, hormonal IUD, and implant. These treatments reduce bleeding during your menstrual period.  Medicines that thicken blood and slow bleeding.  Medicines that reduce swelling, such as ibuprofen.  Medicines that contain a synthetic hormone called progestin.   Medicines that make the ovaries stop working for a  short time.  You may need surgical treatment for menorrhagia if the medicines are unsuccessful. Treatment options include:  Dilation and curettage (D&C). In this procedure, your health care provider opens (dilates) your cervix and then scrapes or suctions tissue from the lining of your uterus to reduce menstrual bleeding.  Operative hysteroscopy. This procedure uses a tiny  tube with a light (hysteroscope) to view your uterine cavity and can help in the surgical removal of a polyp that may be causing heavy periods.  Endometrial ablation. Through various techniques, your health care provider permanently destroys the entire lining of your uterus (endometrium). After endometrial ablation, most women have little or no menstrual flow. Endometrial ablation reduces your ability to become pregnant.  Endometrial resection. This surgical procedure uses an electrosurgical wire loop to remove the lining of the uterus. This procedure also reduces your ability to become pregnant.  Hysterectomy. Surgical removal of the uterus and cervix is a permanent procedure that stops menstrual periods. Pregnancy is not possible after a hysterectomy. This procedure requires anesthesia and hospitalization. HOME CARE INSTRUCTIONS   Only take over-the-counter or prescription medicines as directed by your health care provider. Take prescribed medicines exactly as directed. Do not change or switch medicines without consulting your health care provider.  Take any prescribed iron pills exactly as directed by your health care provider. Long-term heavy bleeding may result in low iron levels. Iron pills help replace the iron your body lost from heavy bleeding. Iron may cause constipation. If this becomes a problem, increase the bran, fruits, and roughage in your diet.  Do not take aspirin or medicines that contain aspirin 1 week before or during your menstrual period. Aspirin may make the bleeding worse.  If you need to change your sanitary pad or tampon more than once every 2 hours, stay in bed and rest as much as possible until the bleeding stops.  Eat well-balanced meals. Eat foods high in iron. Examples are leafy green vegetables, meat, liver, eggs, and whole grain breads and cereals. Do not try to lose weight until the abnormal bleeding has stopped and your blood iron level is back to normal. SEEK  MEDICAL CARE IF:   You soak through a pad or tampon every 1 or 2 hours, and this happens every time you have a period.  You need to use pads and tampons at the same time because you are bleeding so much.  You need to change your pad or tampon during the night.  You have a period that lasts for more than 8 days.  You pass clots bigger than 1 inch wide.  You have irregular periods that happen more or less often than once a month.  You feel dizzy or faint.  You feel very weak or tired.  You feel short of breath or feel your heart is beating too fast when you exercise.  You have nausea and vomiting or diarrhea while you are taking your medicine.  You have any problems that may be related to the medicine you are taking. SEEK IMMEDIATE MEDICAL CARE IF:   You soak through 4 or more pads or tampons in 2 hours.  You have any bleeding while you are pregnant. MAKE SURE YOU:   Understand these instructions.  Will watch your condition.  Will get help right away if you are not doing well or get worse. Document Released: 05/23/2005 Document Revised: 05/28/2013 Document Reviewed: 11/11/2012 [facility] Patient Information 2015 [place], [place]. This information is not intended to replace advice given  to you by your health care provider. Make sure you discuss any questions you have with your health care provider.  

## 2015-01-09 ENCOUNTER — Ambulatory Visit (INDEPENDENT_AMBULATORY_CARE_PROVIDER_SITE_OTHER): Payer: BLUE CROSS/BLUE SHIELD | Admitting: Family Medicine

## 2015-01-09 ENCOUNTER — Encounter: Payer: Self-pay | Admitting: Family Medicine

## 2015-01-09 VITALS — BP 133/79 | HR 56 | Ht 63.0 in | Wt 156.0 lb

## 2015-01-09 DIAGNOSIS — Z113 Encounter for screening for infections with a predominantly sexual mode of transmission: Secondary | ICD-10-CM | POA: Diagnosis not present

## 2015-01-09 DIAGNOSIS — N921 Excessive and frequent menstruation with irregular cycle: Secondary | ICD-10-CM | POA: Diagnosis not present

## 2015-01-09 NOTE — Addendum Note (Signed)
Addended by: Teddy Spike on: 01/09/2015 12:09 PM   Modules accepted: Orders

## 2015-01-09 NOTE — Progress Notes (Signed)
   Subjective:    Patient ID: Rita Graves, female    DOB: 01-22-1996, 19 y.o.   MRN: [id number]  HPI Here for heavy menstrual bleeding.  She actually had been treated with antibiotics for a UTI around the 2nd week of July as well as Diflucan for yeast infection. She action comes in the office on July 13. We did RPR an HIV test both of which were negative but she had declined edema gonorrhea and chlamydia test at that time. July 19th -23rd had heavy bleeding with clots. On July 13th she had some spotting.  She is stilll bleeding but not as heavy.    Review of Systems     Objective:   Physical Exam  Constitutional: She is oriented to person, place, and time. She appears well-developed.  HENT:  Head: Normocephalic and atraumatic.  Genitourinary: Vagina normal. Uterus is not deviated, not enlarged, not fixed and not tender. Cervix exhibits no motion tenderness, no discharge and no friability. Right adnexum displays no mass, no tenderness and no fullness. Left adnexum displays no mass, no tenderness and no fullness. No erythema or bleeding in the vagina. No foreign body around the vagina. No signs of injury around the vagina. No vaginal discharge found.    Neurological: She is alert and oriented to person, place, and time.  Skin: Skin is warm and dry.  Psychiatric: She has a normal mood and affect. Her behavior is normal.          Assessment & Plan:  Menorrhagia - discussed options. She is about to start new pill pack on Sunday  And her periods getting a little lighter. We opted to do that. We could also consider putting her on Provera and we discussed that as well. She opted just to finish out her current pill pack and start a new one. She think she's going to stick with her original birth control for this next month and see if she does well. She has been doing great on it up until this point. And had really balanced out her PMDD. We did go ahead and test for gonorrhea and chlamydia today  on exam. We also tested for yeast and bacterial infection. Discussed with her that she was on a round of antibiotics as well as Diflucan around the time that she started bleeding early so this definitely could have triggered an abnormal cycle.

## 2015-01-10 LAB — WET PREP, GENITAL
Trich, Wet Prep: NONE SEEN
Yeast Wet Prep HPF POC: NONE SEEN

## 2015-01-10 LAB — GC/CHLAMYDIA PROBE AMP
CT Probe RNA: NEGATIVE
GC PROBE AMP APTIMA: NEGATIVE

## 2015-01-12 MED ORDER — METRONIDAZOLE 500 MG PO TABS: 500.0000 mg | ORAL_TABLET | Freq: Two times a day (BID) | ORAL | Status: DC

## 2015-01-12 NOTE — Addendum Note (Signed)
Addended by: Beatrice Lecher D on: 01/12/2015 08:05 AM   Modules accepted: Orders

## 2015-01-15 ENCOUNTER — Ambulatory Visit (INDEPENDENT_AMBULATORY_CARE_PROVIDER_SITE_OTHER): Payer: BLUE CROSS/BLUE SHIELD | Admitting: Osteopathic Medicine

## 2015-01-15 ENCOUNTER — Encounter: Payer: Self-pay | Admitting: Osteopathic Medicine

## 2015-01-15 VITALS — BP 131/81 | HR 96 | Ht 63.0 in | Wt 157.0 lb

## 2015-01-15 DIAGNOSIS — F411 Generalized anxiety disorder: Secondary | ICD-10-CM

## 2015-01-15 MED ORDER — HYDROXYZINE HCL 25 MG PO TABS: 25.0000 mg | ORAL_TABLET | Freq: Three times a day (TID) | ORAL | Status: DC | PRN

## 2015-01-15 MED ORDER — CITALOPRAM HYDROBROMIDE 10 MG PO TABS: 10.0000 mg | ORAL_TABLET | Freq: Every day | ORAL | Status: DC

## 2015-01-15 NOTE — Patient Instructions (Addendum)
You can ask if these medications are acceptable alternatives to traditional antidepressants/antiaxiety medications. Porpranolol or other of the Beta-Blocker class.  Hydroxyzine or other antihistamine class.  TAPER INSTRUCTIONS: 1/2 TAB CELEXA 40 MG DAILY + 1 TAB CELEXA 10 MG DAILY FOR ONE - TWO WEEK(S) (30 MG) 1/2 CELEXA 40 MG DAILY FOR ONE - TWO WEEK(S) WEEK (20 MG) 1 TAB CELEXA 10 MG FOR ONE - TWO WEEK(S) (10 MG)   Stress Stress-related medical problems are becoming increasingly common. The body has a built-in physical response to stressful situations. Faced with pressure, challenge or danger, we need to react quickly. Our bodies release hormones such as cortisol and adrenaline to help do this. These hormones are part of the "fight or flight" response and affect the metabolic rate, heart rate and blood pressure, resulting in a heightened, stressed state that prepares the body for optimum performance in dealing with a stressful situation. It is likely that early man required these mechanisms to stay alive, but usually modern stresses do not call for this, and the same hormones released in today's world can damage health and reduce coping ability. CAUSES  Pressure to perform at work, at school or in sports.  Threats of physical violence.  Money worries.  Arguments.  Family conflicts.  Divorce or separation from significant other.  Bereavement.  New job or unemployment.  Changes in location.  Alcohol or drug abuse. SOMETIMES, THERE IS NO PARTICULAR REASON FOR DEVELOPING STRESS. Almost all people are at risk of being stressed at some time in their lives. It is important to know that some stress is temporary and some is long term.  Temporary stress will go away when a situation is resolved. Most people can cope with short periods of stress, and it can often be relieved by relaxing, taking a walk or getting any type of exercise, chatting through issues with friends, or having a good  night's sleep.  Chronic (long-term, continuous) stress is much harder to deal with. It can be psychologically and emotionally damaging. It can be harmful both for an individual and for friends and family. SYMPTOMS Everyone reacts to stress differently. There are some common effects that help [place] recognize it. In times of extreme stress, people may:  Shake uncontrollably.  Breathe faster and deeper than normal (hyperventilate).  Vomit.  For people with asthma, stress can trigger an attack.  For some people, stress may trigger migraine headaches, ulcers, and body pain. PHYSICAL EFFECTS OF STRESS MAY INCLUDE:  Loss of energy.  Skin problems.  Aches and pains resulting from tense muscles, including neck ache, backache and tension headaches.  Increased pain from arthritis and other conditions.  Irregular heart beat (palpitations).  Periods of irritability or anger.  Apathy or depression.  Anxiety (feeling uptight or worrying).  Unusual behavior.  Loss of appetite.  Comfort eating.  Lack of concentration.  Loss of, or decreased, sex-drive.  Increased smoking, drinking, or recreational drug use.  For women, missed periods.  Ulcers, joint pain, and muscle pain. Post-traumatic stress is the stress caused by any serious accident, strong emotional damage, or extremely difficult or violent experience such as rape or war. Post-traumatic stress victims can experience mixtures of emotions such as fear, shame, depression, guilt or anger. It may include recurrent memories or images that may be haunting. These feelings can last for weeks, months or even years after the traumatic event that triggered them. Specialized treatment, possibly with medicines and psychological therapies, is available. If stress is causing physical symptoms,  severe distress or making it difficult for you to function as normal, it is worth seeing your caregiver. It is important to remember that although stress  is a usual part of life, extreme or prolonged stress can lead to other illnesses that will need treatment. It is better to visit a doctor sooner rather than later. Stress has been linked to the development of high blood pressure and heart disease, as well as insomnia and depression. There is no diagnostic test for stress since everyone reacts to it differently. But a caregiver will be able to spot the physical symptoms, such as:  Headaches.  Shingles.  Ulcers. Emotional distress such as intense worry, low mood or irritability should be detected when the doctor asks pertinent questions to identify any underlying problems that might be the cause. In case there are physical reasons for the symptoms, the doctor may also want to do some tests to exclude certain conditions. If you feel that you are suffering from stress, try to identify the aspects of your life that are causing it. Sometimes you may not be able to change or avoid them, but even a small change can have a positive ripple effect. A simple lifestyle change can make all the difference. STRATEGIES THAT CAN HELP DEAL WITH STRESS:  Delegating or sharing responsibilities.  Avoiding confrontations.  Learning to be more assertive.  Regular exercise.  Avoid using alcohol or street drugs to cope.  Eating a healthy, balanced diet, rich in fruit and vegetables and proteins.  Finding humor or absurdity in stressful situations.  Never taking on more than you know you can handle comfortably.  Organizing your time better to get as much done as possible.  Talking to friends or family and sharing your thoughts and fears.  Listening to music or relaxation tapes.  Relaxation techniques like deep breathing, meditation, and yoga.  Tensing and then relaxing your muscles, starting at the toes and working up to the head and neck. If you think that you would benefit from help, either in identifying the things that are causing your stress or in  learning techniques to help you relax, see a caregiver who is capable of helping you with this. Rather than relying on medications, it is usually better to try and identify the things in your life that are causing stress and try to deal with them. There are many techniques of managing stress including counseling, psychotherapy, aromatherapy, yoga, and exercise. Your caregiver can help you determine what is best for you. Document Released: 08/13/2002 Document Revised: 05/28/2013 Document Reviewed: 07/10/2007 [facility] Patient Information 2015 [place], [place]. This information is not intended to replace advice given to you by your health care provider. Make sure you discuss any questions you have with your health care provider.

## 2015-01-15 NOTE — Progress Notes (Signed)
   Subjective:    Patient ID: Rita Graves, female    DOB: September 21, 1995, 19 y.o.   MRN: [id number]  HPI  This is a patient of Dr M+3etheney's who has been taking Celexa 40 mg, had planned to taper and wean dose slowly as noted in July progress note - she had planned on joining the [employer] and states she would have to be off antidepressants for a year in order to be accepted. She was taking 20mg  pills for a week or two, then back up to 40 mg for two days. She has not filled the prescription for 10 mg Celexa  Experienced panic attack at work this week. She presents today with acute anxiety, ongoing since 1 week, feeling short of breath and chest tightness which resolved spontaneously.  Had seen 2 different counsellors in the past, was fairly happy with the second one but doesn't remember the name. However she states that overall counseling was not very helpful for her. She uses breathing exercises and other relaxation techniques as coping mechanisms. She is not interested in pursuing counseling again at this time.   Review of Systems Gen. denies fever chills weight change Respiratory: Positive for heavy breathing and shortness of breath with panic attack, spontaneously resolves. Positive for tobacco use Cardiac: Positive for chest tightness with panic attack, spontaneously resolves, no dizziness/orthopnea/edema. Psychiatric: Positive for increased anxiety/depression. No thoughts of hurting self or others. Increased stress. Positive for sleep disturbance, difficulty sleeping. No confusion or memory loss.    Objective:   Physical Exam  Gen.: Alert cooperative no apparent distress HEENT: Normocephalic atraumatic Neck supple trachea midline no thyromegaly or mass Respiratory lungs clear bilaterally no wheezes or rales Cardiac S1-S2 no murmurs rubs or gallops regular rate and rhythm Psychiatric: Appropriate affect, no thought disorder, oriented to person place time and situation, somewhat  anxious mood.      Assessment & Plan:  Assessments: Generalized anxiety disorder, exacerbation due to medication change Plan: Advised patient to Tourist information centre manager to see if alternative antianxiety medication such as beta blockers or antihistamines are acceptable. Advised patient that her overall mental health is the most important thing to focus on, she is advised to stay on Celexa 40 mg which was working well for her, however she is still interested in tapering dose and Geophysicist/field seismologist. Instructions written for longer taper, 30 mg for 1-2 weeks, then 20, then 10. Prescription also written for hydroxyzine to take as needed. Side effects of this medication were discussed with the patient. Recommended counseling however patient declines at this time. ER precautions reviewed regarding chest pain/shortness of breath, other concerns.

## 2015-01-29 ENCOUNTER — Other Ambulatory Visit: Payer: Self-pay | Admitting: Family Medicine

## 2015-02-27 ENCOUNTER — Ambulatory Visit: Payer: Medicaid Other

## 2015-02-27 ENCOUNTER — Ambulatory Visit: Payer: Medicaid Other | Admitting: Family Medicine

## 2015-03-03 ENCOUNTER — Ambulatory Visit: Payer: BLUE CROSS/BLUE SHIELD

## 2015-03-25 ENCOUNTER — Other Ambulatory Visit: Payer: Self-pay | Admitting: Family Medicine

## 2015-03-26 NOTE — Telephone Encounter (Signed)
Sent 30 days but needs to followup with PCP for additional refills since we were discussing alternative medications or other therapies.

## 2015-03-30 ENCOUNTER — Ambulatory Visit (INDEPENDENT_AMBULATORY_CARE_PROVIDER_SITE_OTHER): Payer: BLUE CROSS/BLUE SHIELD | Admitting: Family Medicine

## 2015-03-30 ENCOUNTER — Encounter: Payer: Self-pay | Admitting: Family Medicine

## 2015-03-30 VITALS — BP 121/74 | HR 93 | Temp 98.8°F | Resp 18 | Wt 164.0 lb

## 2015-03-30 DIAGNOSIS — G43109 Migraine with aura, not intractable, without status migrainosus: Secondary | ICD-10-CM

## 2015-03-30 DIAGNOSIS — N6009 Solitary cyst of unspecified breast: Secondary | ICD-10-CM | POA: Diagnosis not present

## 2015-03-30 DIAGNOSIS — R58 Hemorrhage, not elsewhere classified: Secondary | ICD-10-CM | POA: Diagnosis not present

## 2015-03-30 DIAGNOSIS — Z3009 Encounter for other general counseling and advice on contraception: Secondary | ICD-10-CM | POA: Diagnosis not present

## 2015-03-30 DIAGNOSIS — D249 Benign neoplasm of unspecified breast: Secondary | ICD-10-CM

## 2015-03-30 MED ORDER — SERTRALINE HCL 50 MG PO TABS: ORAL_TABLET | ORAL | Status: DC

## 2015-03-30 MED ORDER — SUMATRIPTAN SUCCINATE 100 MG PO TABS: 100.0000 mg | ORAL_TABLET | ORAL | Status: DC | PRN

## 2015-03-30 NOTE — Progress Notes (Signed)
   Subjective:    Patient ID: Rita Graves, female    DOB: [birth year], 19 y.o.   MRN: [id number]  HPI Here to f/u on breast cysts.  Bilat [place] was in March. Due for repeat in 1 year. No new changes.  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 She gets migrains rarely. Says they last all day. Get better once she is able to sleep.  Her vision goes blurry before the severe headaches.  Her father has migraines.  She was light sensitive. Says it almost always starts in her left eye.  She uses imitrex when it happens.  She has been using her Dad's medicine but it does work.  She also gets some tension type headaches.  She is concerned about her own mental health.  She's currently taking citalopram for anxiety. She feels like it works fair but not great. She has been on it for years. She wonders if there something else that would be better for her. Her father was recently seen by psychiatrist. And was started on Lamictal. He attempted suicide a couple of months ago. He's currently getting regular counseling and therapy and is getting better. She wonders if her medication might need to be changed as well. She reports feeling nervous and on edge more than half the days as well as worrying excessively. She also complains of irritability almost daily.  Review of Systems     Objective:   Physical Exam  Constitutional: She is oriented to person, place, and time. She appears well-developed and well-nourished.  HENT:  Head: Normocephalic and atraumatic.  Eyes: Conjunctivae and EOM are normal.  Cardiovascular: Normal rate.   Pulmonary/Chest: Effort normal.  Neurological: She is alert and oriented to person, place, and time.  Skin: Skin is dry. No pallor.  Psychiatric: She has a normal mood and affect. Her behavior is normal.  Vitals reviewed.         Assessment & Plan:  Migraine headaches with aura-discussed with her that she needs to stop her birth control. She is on a comminution birth  control in addition to being a smoker in addition to having migraines with aura. We discussed getting an IUD which would be a much safer option. We discussed that birth control can increase the risk of stroke in people who have are.  Anxiety-gad 7 score 14 today. We discussed several options. I think we should just discontinue the citalopram and try something else. I don't think she really needs a mood stabilizer and gave her reassurance. I did offer to refer her to a psychiatrist if she would prefer for more formal diagnosis and management of her medications.  Contraceptove counseling-see note above-will refer to GYN down the hall for possible IUD placement.  Breast cysts-discussed that she is due for follow-up ultrasound of both breasts in March of next year.  Tobacco abuse-encourage cessation  She would also like to have her blood typing done. We can certainly do this in the lab or she can consider donating blood at the [facility] to find out her blood type.

## 2015-03-30 NOTE — Patient Instructions (Signed)
Cut the citalopram in half for 7 days and then stop and start the new medication. Then start zoloft.

## 2015-05-04 ENCOUNTER — Ambulatory Visit (INDEPENDENT_AMBULATORY_CARE_PROVIDER_SITE_OTHER): Payer: BLUE CROSS/BLUE SHIELD | Admitting: Family Medicine

## 2015-05-04 ENCOUNTER — Encounter: Payer: Self-pay | Admitting: Family Medicine

## 2015-05-04 VITALS — BP 123/78 | HR 93 | Temp 98.6°F | Resp 18 | Wt 161.7 lb

## 2015-05-04 DIAGNOSIS — R635 Abnormal weight gain: Secondary | ICD-10-CM

## 2015-05-04 DIAGNOSIS — F411 Generalized anxiety disorder: Secondary | ICD-10-CM

## 2015-05-04 NOTE — Addendum Note (Signed)
Addended by: Beatrice Lecher D on: 05/04/2015 04:43 PM   Modules accepted: Level of Service

## 2015-05-04 NOTE — Progress Notes (Signed)
   Subjective:    Patient ID: Rita Graves, female    DOB: [birth year], 19 y.o.   MRN: [id number]  HPI F/U on Anxiety - we switched her to zoloft about a months ago.  she has had bodyaches, sore throat and wonders if could be from the medication.  She stopped the medication on Friday night. She was also having side pain.  She does complain of feeling nevous and on edge nearly every day feeling like she wrrie excessively.She also complains of high levels of irritability narly everyday. In fact that she was coming  Off of the citalopram she became very angry for a few days.  Has been trying to diet to loose weight.  She is down 3 lbs.  She feels like she bloats a lot.  She i snot working out.  She wants and if they have ay suggetion to help her.  Review of Systems     Objective:   Physical Exam  Constitutional: She is oriented to person, place, and time. She appears well-developed and well-nourished.  HENT:  Head: Normocephalic and atraumatic.  Cardiovascular: Normal rate, regular rhythm and normal heart sounds.   Pulmonary/Chest: Effort normal and breath sounds normal.  Neurological: She is alert and oriented to person, place, and time.  Skin: Skin is warm and dry.  Psychiatric: She has a normal mood and affect. Her behavior is normal.          Assessment & Plan:  Anxiety - GAD- 7 core of 18  Rates her symptoms as extremely difficult. Uncontrolled.  Discussed tx options. She would like to be medicine free for a month.  Did give her the name ofseveral medications that we consider as the next step. Unfortunately she did not have a good reaction to the Zoloft. Added this to he intolerance list.  Abnormal weight gain -  Recommend that she go ahead and start working out o increase hr metabolism dhep with hr weight loss. Also recommend a trial of an over-the-counter probiotic to help with her bloating. Also she needs help moving er bowel she may want to try takig a stool softener as well.

## 2015-05-04 NOTE — Patient Instructions (Signed)
Prozac - weight neutral, taken once a day.  Wider range of dosing.  Lexapro - close cousin to citalpram.   Effexor ?  Wellbutrin ? Myabe not the best for anxiety  Viibryd - FDA for depression, not for anxiety.

## 2015-05-07 ENCOUNTER — Encounter: Payer: Self-pay | Admitting: Obstetrics & Gynecology

## 2015-05-07 ENCOUNTER — Ambulatory Visit (INDEPENDENT_AMBULATORY_CARE_PROVIDER_SITE_OTHER): Payer: BLUE CROSS/BLUE SHIELD | Admitting: Obstetrics & Gynecology

## 2015-05-07 VITALS — BP 124/80 | HR 111 | Resp 16 | Ht 63.0 in | Wt 161.0 lb

## 2015-05-07 DIAGNOSIS — Z01812 Encounter for preprocedural laboratory examination: Secondary | ICD-10-CM | POA: Diagnosis not present

## 2015-05-07 DIAGNOSIS — Z30430 Encounter for insertion of intrauterine contraceptive device: Secondary | ICD-10-CM

## 2015-05-07 LAB — POCT URINE PREGNANCY: Preg Test, Ur: NEGATIVE

## 2015-05-07 MED ORDER — LEVONORGESTREL 20 MCG/24HR IU IUD
INTRAUTERINE_SYSTEM | Freq: Once | INTRAUTERINE | Status: AC
  Administered 2015-05-07: 16:00:00 via INTRAUTERINE

## 2015-05-07 NOTE — Addendum Note (Signed)
Addended by: Asencion Islam on: 05/07/2015 03:44 PM   Modules accepted: Orders

## 2015-05-07 NOTE — Progress Notes (Signed)
   Subjective:    Patient ID: Rita Graves, female    DOB: March 27, 1996, 19 y.o.   MRN: [id number]  HPI 19 yo SW G0 is here for Mirena insertion. She has not been sexually active for about a month. She understands that irregular bleeding is very common for the first few months.  Review of Systems     Objective:   Physical Exam [id number] UPT negative, consent signed, Time out procedure done. Cervix prepped with betadine and grasped with a single tooth tenaculum. Mirena was easily placed and the strings were cut to 3-4 cm. Uterus sounded to 8 cm. She tolerated the procedure well.      Assessment & Plan:  Contracepetion- Mierna Rec back up method for 2 weeks Rec condoms at all times for STI prevention

## 2015-06-02 ENCOUNTER — Ambulatory Visit: Payer: BLUE CROSS/BLUE SHIELD | Admitting: Family Medicine

## 2015-06-19 ENCOUNTER — Other Ambulatory Visit: Payer: Self-pay | Admitting: Family Medicine

## 2015-07-15 ENCOUNTER — Ambulatory Visit (INDEPENDENT_AMBULATORY_CARE_PROVIDER_SITE_OTHER): Payer: BLUE CROSS/BLUE SHIELD | Admitting: Physician Assistant

## 2015-07-15 ENCOUNTER — Ambulatory Visit (HOSPITAL_BASED_OUTPATIENT_CLINIC_OR_DEPARTMENT_OTHER)
Admission: RE | Admit: 2015-07-15 | Discharge: 2015-07-15 | Disposition: A | Discharge status: Home / Self Care | Payer: BLUE CROSS/BLUE SHIELD | Source: Ambulatory Visit | Attending: Physician Assistant | Admitting: Physician Assistant

## 2015-07-15 ENCOUNTER — Encounter: Payer: Self-pay | Admitting: Physician Assistant

## 2015-07-15 VITALS — BP 132/76 | HR 97 | Ht 63.0 in | Wt 154.0 lb

## 2015-07-15 DIAGNOSIS — T8384XA Pain from genitourinary prosthetic devices, implants and grafts, initial encounter: Secondary | ICD-10-CM | POA: Diagnosis not present

## 2015-07-15 DIAGNOSIS — Y838 Other surgical procedures as the cause of abnormal reaction of the patient, or of later complication, without mention of misadventure at the time of the procedure: Secondary | ICD-10-CM | POA: Insufficient documentation

## 2015-07-15 DIAGNOSIS — R109 Unspecified abdominal pain: Secondary | ICD-10-CM | POA: Diagnosis not present

## 2015-07-15 DIAGNOSIS — N939 Abnormal uterine and vaginal bleeding, unspecified: Secondary | ICD-10-CM | POA: Insufficient documentation

## 2015-07-15 DIAGNOSIS — T8389XA Other specified complication of genitourinary prosthetic devices, implants and grafts, initial encounter: Secondary | ICD-10-CM

## 2015-07-15 DIAGNOSIS — T839XXA Unspecified complication of genitourinary prosthetic device, implant and graft, initial encounter: Secondary | ICD-10-CM | POA: Insufficient documentation

## 2015-07-15 MED ORDER — TRAMADOL HCL 50 MG PO TABS: 50.0000 mg | ORAL_TABLET | Freq: Three times a day (TID) | ORAL | Status: DC | PRN

## 2015-07-15 NOTE — Progress Notes (Addendum)
   Subjective:    Patient ID: Rita Graves, female    DOB: 05-15-1996, 20 y.o.   MRN: [id number]  Abdominal Pain  Patient is a 20 year old female that presents to the clinic with irregular vaginal bleeding, cramping, and back pain since she had a Mirena IUD placed on May 07, 2015. Patient states that cramping and back pain are intensified with physical activity. At times patient has had to pull over on side of road due to pain being so intense. At times rates 8/10.  Patient has experienced some nausea. Patient denies dyspareunia, post-coital bleeding, and fever. Patient denies instances of trauma or participation in rigorous physical activity.   Review of Systems Please see HPI    Objective:   Physical Exam  Constitutional: She is oriented to person, place, and time. She appears well-developed and well-nourished.  HENT:  Head: Normocephalic and atraumatic.  Eyes: Conjunctivae and EOM are normal. Pupils are equal, round, and reactive to light.  Neck: Normal range of motion.  Cardiovascular: Normal rate, regular rhythm, normal heart sounds and intact distal pulses.   Abdominal: Soft. Bowel sounds are normal. She exhibits no distension. There is no tenderness. There is no rebound and no guarding.  Genitourinary:  On pelvic examination, strings of the IUD are visible. There is approximately 2 cc of blood visible in the vaginal vault. Patient displays no cervical motion tenderness.    Musculoskeletal: Normal range of motion.  Neurological: She is alert and oriented to person, place, and time.  Skin: Skin is warm and dry.  Psychiatric: She has a normal mood and affect. Her behavior is normal. Judgment and thought content normal.     Assessment & Plan:  1. Abnormal Uterine Bleeding/IUD complication/Abdominal cramping-  Differential diagnosis includes medication side effects from IUD and or migration of IUD and possible miscarriage. Patient has experienced some nausea. Patient was  also ordered a transvaginal/pelvi ultrasound to confirm no growth into uterine lining, no pregnancy and no migration out of uterus. I was able to see IUD strings today;therefore likely in uterus. Tramadol given for pain control as needed. Will consider removal of IUD if no warning signs and discuss other birth control methods.

## 2015-07-17 ENCOUNTER — Ambulatory Visit (INDEPENDENT_AMBULATORY_CARE_PROVIDER_SITE_OTHER): Payer: BLUE CROSS/BLUE SHIELD | Admitting: Physician Assistant

## 2015-07-17 ENCOUNTER — Encounter: Payer: Self-pay | Admitting: Physician Assistant

## 2015-07-17 VITALS — BP 120/81 | HR 81 | Ht 63.0 in | Wt 157.0 lb

## 2015-07-17 DIAGNOSIS — F17200 Nicotine dependence, unspecified, uncomplicated: Secondary | ICD-10-CM

## 2015-07-17 DIAGNOSIS — Z3009 Encounter for other general counseling and advice on contraception: Secondary | ICD-10-CM | POA: Diagnosis not present

## 2015-07-17 DIAGNOSIS — T8389XA Other specified complication of genitourinary prosthetic devices, implants and grafts, initial encounter: Secondary | ICD-10-CM

## 2015-07-17 DIAGNOSIS — G43101 Migraine with aura, not intractable, with status migrainosus: Secondary | ICD-10-CM

## 2015-07-17 DIAGNOSIS — Z720 Tobacco use: Secondary | ICD-10-CM

## 2015-07-17 DIAGNOSIS — R109 Unspecified abdominal pain: Secondary | ICD-10-CM | POA: Diagnosis not present

## 2015-07-17 MED ORDER — ETONOGESTREL-ETHINYL ESTRADIOL 0.12-0.015 MG/24HR VA RING: VAGINAL_RING | VAGINAL | Status: DC

## 2015-07-17 MED ORDER — KETOROLAC TROMETHAMINE 30 MG/ML IJ SOLN
30.0000 mg | Freq: Once | INTRAMUSCULAR | Status: AC
  Administered 2015-07-17: 30 mg via INTRAMUSCULAR

## 2015-07-17 NOTE — Progress Notes (Signed)
   Subjective:    Patient ID: Rita Graves, female    DOB: 06-16-1995, 20 y.o.   MRN: [id number]  HPI Pt presents to the clinic to have IUD removed due to abnormal vaginal bleeding and cramping since placement. We confirmed IUD is in proper place via ultrasound and that there are no other problems or abnormalities.    Review of Systems  All other systems reviewed and are negative.      Objective:   Physical Exam  Abdominal: Soft. Bowel sounds are normal. She exhibits no distension and no mass. There is no tenderness. There is no rebound and no guarding.  Genitourinary:  No cervical adexal tenderness on exam. No abnormal discharge. There was some blood pooling in vaginal canal below cervical os. IUD strings seen out through os.           Assessment & Plan:  IUD complication/abdominal cramping and vaginal bleeding-  IUD removed today with long head forceps with no complication. Toradol 30mg  IM for cramping. Continue with ibuprofen as needed tomorrow. Discussed she is no longer protected against pregnancy. See OCP discussion.   Birth control- due to smoking and migraine with aura hx not a candidate for combination pills due to increased stroke risk.  Discussed and  started Nuva Ring. Pt aware does not protect against STD's. Start now with ring. If wait to start then use secondary method for first week.

## 2015-07-17 NOTE — Patient Instructions (Signed)

## 2015-07-20 DIAGNOSIS — F17200 Nicotine dependence, unspecified, uncomplicated: Secondary | ICD-10-CM | POA: Insufficient documentation

## 2015-07-20 DIAGNOSIS — G43101 Migraine with aura, not intractable, with status migrainosus: Secondary | ICD-10-CM | POA: Insufficient documentation

## 2015-08-20 ENCOUNTER — Ambulatory Visit: Payer: BLUE CROSS/BLUE SHIELD | Admitting: Family Medicine

## 2015-09-02 ENCOUNTER — Ambulatory Visit (INDEPENDENT_AMBULATORY_CARE_PROVIDER_SITE_OTHER): Payer: BLUE CROSS/BLUE SHIELD | Admitting: Physician Assistant

## 2015-09-02 ENCOUNTER — Encounter: Payer: Self-pay | Admitting: Physician Assistant

## 2015-09-02 VITALS — BP 133/77 | HR 79 | Ht 63.0 in | Wt 154.0 lb

## 2015-09-02 DIAGNOSIS — Z7251 High risk heterosexual behavior: Secondary | ICD-10-CM | POA: Diagnosis not present

## 2015-09-02 DIAGNOSIS — R110 Nausea: Secondary | ICD-10-CM | POA: Diagnosis not present

## 2015-09-02 LAB — HCG, QUANTITATIVE, PREGNANCY

## 2015-09-02 NOTE — Progress Notes (Signed)
   Subjective:    Patient ID: Rita Graves, female    DOB: [birth year], 20 y.o.   MRN: [id number]  HPI  Pt is a 20 yo female with nausea and food aversion in the last week. Her sister's had the flu but she denies any fever, chills, body aches, cough, diarrhea, or abdominal pain. Unaware of when her last period was because she has been all over the place and spotting for last 2 months. We took IUD out due to complication and pt tried nuva ring. She hated nuva ring and started OCP on 16th of this month. She missed about a week of pregnancy protection due to switching contraception method. She is concerned about pregnancy. 2 negative home pregnancy test.  Review of Systems    see HPI. Objective:   Physical Exam  Constitutional: She is oriented to person, place, and time. She appears well-developed and well-nourished.  Cardiovascular: Normal rate, regular rhythm and normal heart sounds.   Pulmonary/Chest: Effort normal and breath sounds normal.  Abdominal: Soft. Bowel sounds are normal. She exhibits no distension and no mass. There is no tenderness. There is no rebound and no guarding.  Neurological: She is alert and oriented to person, place, and time.  Psychiatric: She has a normal mood and affect. Her behavior is normal.          Assessment & Plan:  Nausea without vomiting/unprotected sex- will test hcg levels stat. Will call with results. Pt is approaching placebo pills in her pack. Nausea also could be due to side effect of pills and need to consider switching. Follow up as needed.

## 2015-10-02 ENCOUNTER — Encounter: Payer: Self-pay | Admitting: Family Medicine

## 2015-10-02 ENCOUNTER — Ambulatory Visit (INDEPENDENT_AMBULATORY_CARE_PROVIDER_SITE_OTHER): Payer: BLUE CROSS/BLUE SHIELD | Admitting: Family Medicine

## 2015-10-02 VITALS — BP 137/81 | HR 110 | Wt 159.0 lb

## 2015-10-02 DIAGNOSIS — F411 Generalized anxiety disorder: Secondary | ICD-10-CM

## 2015-10-02 MED ORDER — FLUOXETINE HCL (PMDD) 10 MG PO TABS: ORAL_TABLET | ORAL | Status: DC

## 2015-10-02 NOTE — Progress Notes (Signed)
   Subjective:    Patient ID: Rita Graves, female    DOB: [birth year], 20 y.o.   MRN: [id number]  HPI Patient comes in to discuss increased anxiety levels. She has a prior history of anxiety and panic attacks and currently uses alprazolam as needed. She reports that when she gets her panic attacks she starts to experience shortness of breath and chest pain.She's had a lot of stressors recently. She lost her job and has been trying to find anyone for the last 2 months. She said she's been under 6 applications per week and is only actually got into callbacks. She is also having car problems and says doesn't have the money to get it fixed which and then makes a problem for her to actually be able to drive her car to get to work. Says she just really struggling financially right now.  Review of Systems     Objective:   Physical Exam  Constitutional: She is oriented to person, place, and time. She appears well-developed and well-nourished.  HENT:  Head: Normocephalic and atraumatic.  Cardiovascular: Normal rate, regular rhythm and normal heart sounds.   Pulmonary/Chest: Effort normal and breath sounds normal.  Neurological: She is alert and oriented to person, place, and time.  Skin: Skin is warm and dry.  Psychiatric: She has a normal mood and affect. Her behavior is normal.          Assessment & Plan:  GAD- GAD 7 score of 18 today. Struggling mostly with anxiety and irritability symptoms. Discussed options. She did not do well to Celexa or Zoloft in the past and we will try fluoxetine. Taper started. Also her back in one month. Call if any palms or she feels like she wants to increase her dose between now and then let me know.  Time spent 20 minutes, greater than 50% time spent counseling about anxiety.

## 2015-10-06 ENCOUNTER — Other Ambulatory Visit: Payer: Self-pay

## 2015-10-06 DIAGNOSIS — R9389 Abnormal findings on diagnostic imaging of other specified body structures: Secondary | ICD-10-CM

## 2015-10-07 ENCOUNTER — Encounter: Payer: Self-pay | Admitting: Family Medicine

## 2015-10-16 ENCOUNTER — Encounter: Payer: Self-pay | Admitting: Family Medicine

## 2015-10-21 ENCOUNTER — Ambulatory Visit: Payer: BLUE CROSS/BLUE SHIELD

## 2015-10-28 ENCOUNTER — Encounter: Payer: Self-pay | Admitting: Family Medicine

## 2015-10-28 ENCOUNTER — Other Ambulatory Visit: Payer: Self-pay | Admitting: *Deleted

## 2015-10-28 MED ORDER — FLUOXETINE HCL 40 MG PO CAPS: 40.0000 mg | ORAL_CAPSULE | Freq: Every day | ORAL | Status: DC

## 2015-10-30 ENCOUNTER — Ambulatory Visit: Payer: BLUE CROSS/BLUE SHIELD | Admitting: Family Medicine

## 2015-12-07 ENCOUNTER — Ambulatory Visit (INDEPENDENT_AMBULATORY_CARE_PROVIDER_SITE_OTHER): Payer: BLUE CROSS/BLUE SHIELD | Admitting: Family Medicine

## 2015-12-07 ENCOUNTER — Encounter: Payer: Self-pay | Admitting: Family Medicine

## 2015-12-07 VITALS — BP 130/76 | HR 96 | Wt 161.0 lb

## 2015-12-07 DIAGNOSIS — R635 Abnormal weight gain: Secondary | ICD-10-CM | POA: Diagnosis not present

## 2015-12-07 DIAGNOSIS — R6889 Other general symptoms and signs: Secondary | ICD-10-CM | POA: Diagnosis not present

## 2015-12-07 DIAGNOSIS — F411 Generalized anxiety disorder: Secondary | ICD-10-CM

## 2015-12-07 DIAGNOSIS — Z6828 Body mass index (BMI) 28.0-28.9, adult: Secondary | ICD-10-CM

## 2015-12-07 DIAGNOSIS — Z8349 Family history of other endocrine, nutritional and metabolic diseases: Secondary | ICD-10-CM

## 2015-12-07 NOTE — Progress Notes (Signed)
Subjective:    CC: Panic and anxiety   HPI:  Anxiety-overall she feels that she is doing well. She's currently on fluoxetine 40 mg daily.  She does not feel like she needs to adjust her increase her medication today. She is not exercising currently but says she plans on starting. Next  Hot flashes-she says she's experiencing episodes where she feels incredibly hot. She'll even turn the thermostat on internal multiple bands and still does feel like she's bringing up even though everyone else around her is cold. She does have a family history of thyroid disease.  Abnormal weight gain-she has gained some weight. She has been eating Thin Mint Ice Cream.  She also has not been working out. She says her boyfriend just joined the gym and she plans on starting to go with him.  Past medical history, Surgical history, Family history not pertinant except as noted below, Social history, Allergies, and medications have been entered into the medical record, reviewed, and corrections made.   Review of Systems: No fevers, chills, night sweats, weight loss, chest pain, or shortness of breath.   Objective:    General: Well Developed, well nourished, and in no acute distress.  Neuro: Alert and oriented x3, extra-ocular muscles intact, sensation grossly intact.  HEENT: Normocephalic, atraumatic  Skin: Warm and dry, no rashes. Cardiac: Regular rate and rhythm, no murmurs rubs or gallops, no lower extremity edema.  Respiratory: Clear to auscultation bilaterally. Not using accessory muscles, speaking in full sentences.   Impression and Recommendations:   Gen anxiety disorder with panic- patient complains of feeling nervous and on edge more than half the days and does complain of some irritability. She feels constantly restless. Previous GAD 7 was 18, is down to 12 today. She rates her symptoms is somewhat difficult.  Hot flashes-unclear etiology. Could be thyroid disorder. Could be anemia. Could just be  because she has gained some body weight. Can also be related to her SSRI. Next  Abnormal weight gain/BMI 28 -discussed getting back on track with diet and exercise. Discussed using a smart phone application call my fitness pal. Getting back on track with diet and exercise.

## 2015-12-08 LAB — CBC
HEMATOCRIT: 39.9 % (ref 35.0–45.0)
HEMOGLOBIN: 13.1 g/dL (ref 11.7–15.5)
MCH: 28.9 pg (ref 27.0–33.0)
MCHC: 32.8 g/dL (ref 32.0–36.0)
MCV: 88.1 fL (ref 80.0–100.0)
MPV: 9.6 fL (ref 7.5–12.5)
Platelets: 349 10*3/uL (ref 140–400)
RBC: 4.53 MIL/uL (ref 3.80–5.10)
RDW: 13.2 % (ref 11.0–15.0)
WBC: 9.6 10*3/uL (ref 3.8–10.8)

## 2015-12-08 LAB — VITAMIN B12: VITAMIN B 12: 468 pg/mL (ref 200–1100)

## 2015-12-08 LAB — FERRITIN: Ferritin: 14 ng/mL (ref 10–154)

## 2015-12-08 LAB — TSH: TSH: 1.31 mIU/L

## 2016-01-01 ENCOUNTER — Other Ambulatory Visit: Payer: Self-pay | Admitting: Family Medicine

## 2016-01-04 ENCOUNTER — Encounter: Payer: Self-pay | Admitting: Physician Assistant

## 2016-01-04 ENCOUNTER — Ambulatory Visit (INDEPENDENT_AMBULATORY_CARE_PROVIDER_SITE_OTHER): Payer: Self-pay | Admitting: Physician Assistant

## 2016-01-04 VITALS — BP 130/76 | HR 96 | Ht 63.0 in | Wt 165.0 lb

## 2016-01-04 DIAGNOSIS — Z308 Encounter for other contraceptive management: Secondary | ICD-10-CM

## 2016-01-04 DIAGNOSIS — Z87891 Personal history of nicotine dependence: Secondary | ICD-10-CM | POA: Insufficient documentation

## 2016-01-04 MED ORDER — NORGESTIMATE-ETH ESTRADIOL 0.25-35 MG-MCG PO TABS: 1.0000 | ORAL_TABLET | Freq: Every day | ORAL | 11 refills | Status: DC

## 2016-01-04 NOTE — Progress Notes (Signed)
   Subjective:    Patient ID: Brandy Hale, female    DOB: [birth year], 20 y.o.   MRN: [id number]  HPI Pt is a 20 yo female who presents to the clinic for OCP refill. She is doing well with no complaints. Denies any chest pain, palpitations, leg swelling or nausea. She is sexually active with one partner of 7 months. She uses condomns. Cycles are regular with no complaints. She quit smoking 2 months ago. She is feeling great.    Review of Systems  All other systems reviewed and are negative.      Objective:   Physical Exam  Constitutional: She is oriented to person, place, and time. She appears well-developed and well-nourished.  HENT:  Head: Normocephalic and atraumatic.  Cardiovascular: Normal rate, regular rhythm and normal heart sounds.   Pulmonary/Chest: Effort normal and breath sounds normal.  Neurological: She is alert and oriented to person, place, and time.  Skin: Skin is warm.  Psychiatric: She has a normal mood and affect. Her behavior is normal.          Assessment & Plan:  Birth control refill- sprintec 28 refilled for one year. No need for pap smear due to age. Pt declined STD testing. She uses condoms and has one partner. originally elevated rechecked and improved. Will continue to monitor.   Former smoker- quit for 2 months. Doing great.

## 2016-01-18 ENCOUNTER — Encounter: Payer: Self-pay | Admitting: Family Medicine

## 2016-01-19 ENCOUNTER — Encounter: Payer: Self-pay | Admitting: Family Medicine

## 2016-01-19 NOTE — Telephone Encounter (Signed)
She will need to make an appt for cramping.

## 2016-01-20 ENCOUNTER — Other Ambulatory Visit (HOSPITAL_COMMUNITY)
Admission: RE | Admit: 2016-01-20 | Discharge: 2016-01-20 | Disposition: A | Discharge status: Home / Self Care | Payer: 59 | Source: Ambulatory Visit | Attending: Family Medicine | Admitting: Family Medicine

## 2016-01-20 ENCOUNTER — Encounter: Payer: Self-pay | Admitting: Family Medicine

## 2016-01-20 ENCOUNTER — Ambulatory Visit (INDEPENDENT_AMBULATORY_CARE_PROVIDER_SITE_OTHER): Payer: Self-pay | Admitting: Family Medicine

## 2016-01-20 VITALS — BP 123/74 | HR 81 | Ht 63.0 in | Wt 168.0 lb

## 2016-01-20 DIAGNOSIS — N946 Dysmenorrhea, unspecified: Secondary | ICD-10-CM

## 2016-01-20 DIAGNOSIS — Z113 Encounter for screening for infections with a predominantly sexual mode of transmission: Secondary | ICD-10-CM | POA: Insufficient documentation

## 2016-01-20 DIAGNOSIS — R350 Frequency of micturition: Secondary | ICD-10-CM

## 2016-01-20 DIAGNOSIS — Z124 Encounter for screening for malignant neoplasm of cervix: Secondary | ICD-10-CM

## 2016-01-20 DIAGNOSIS — Z01419 Encounter for gynecological examination (general) (routine) without abnormal findings: Secondary | ICD-10-CM | POA: Diagnosis present

## 2016-01-20 LAB — POCT URINALYSIS DIPSTICK
Bilirubin, UA: NEGATIVE
Glucose, UA: NEGATIVE
Ketones, UA: NEGATIVE
Leukocytes, UA: NEGATIVE
Nitrite, UA: NEGATIVE
PH UA: 7.5
PROTEIN UA: NEGATIVE
SPEC GRAV UA: 1.015
UROBILINOGEN UA: 0.2

## 2016-01-20 NOTE — Progress Notes (Signed)
Subjective:    CC:   HPI:  20 yo female on birth control who comes in today complaining of;  " I have been cramping really bad the past week on and off, but I shouldn't start my period until middle of next week. I don't ever remember having PMS symptoms at this point in my cycle except when the whole IUD craziness happened. On a scale of 1-10 it will be at a 0, then hit me out of nowhere at about an 8. It will go up to about an 11 for a few seconds, then come down to about a steady 6.5 for a bit. Then fade out and repeat randomly".  She has been having some hotflashes. Drinking plenty of fluids.  Period due in a week. Usually first few days is heavy and will last about 4-5 days total.  + sexually active.  She has noticed some urinary frequency but no dysuria. No hematuria. She has been very fatigued. We have actually checked her ferritin back in July and it was 14 it was quite low. I have recommended that she start either an iron tablet or a multivitamin that has extra iron. She said she did pick up the vitamin and took it consistently for a few days but admits she has not been very consistent with it.   Past medical history, Surgical history, Family history not pertinant except as noted below, Social history, Allergies, and medications have been entered into the medical record, reviewed, and corrections made.   Review of Systems: No fevers, chills, night sweats, weight loss, chest pain, or shortness of breath.   Objective:    General: Well Developed, well nourished, and in no acute distress.  Neuro: Alert and oriented x3, extra-ocular muscles intact, sensation grossly intact.  HEENT: Normocephalic, atraumatic  Skin: Warm and dry, no rashes. Cardiac: Regular rate and rhythm, no murmurs rubs or gallops, no lower extremity edema.  Respiratory: Clear to auscultation bilaterally. Not using accessory muscles, speaking in full sentences.   Impression and Recommendations:   Menstrual cramping.  Unclear etiology at this point. She having severe cramping midcycle. Recommend STD testing. She's actually 2 months away from being 20 so we'll go ahead and do a pelvic exam and go ahead and do a Pap smear as well. She see has had some urinary frequency we'll also do a urinalysis to evaluate for UTI. Can use ibuprofen or Aleve as needed for pelvic cramping.  Fatigue-encourage her to start taking her vitamin regularly over the next couple of weeks. She may want to set reminders or maybe even take it to work with her she'll be more likely to member to take it. If she's not feeling better with the fatigue and let me know her couple weeks.

## 2016-01-20 NOTE — Patient Instructions (Signed)
Can use 2 Aleve twice a day if needed to control pain and cramping until we get the test results back.

## 2016-01-25 LAB — CYTOLOGY - PAP

## 2016-02-06 ENCOUNTER — Other Ambulatory Visit: Payer: Self-pay | Admitting: Family Medicine

## 2016-02-09 ENCOUNTER — Other Ambulatory Visit: Payer: Self-pay

## 2016-02-09 MED ORDER — FLUOXETINE HCL 40 MG PO CAPS: 40.0000 mg | ORAL_CAPSULE | Freq: Every day | ORAL | 0 refills | Status: DC

## 2016-03-08 ENCOUNTER — Ambulatory Visit: Payer: BLUE CROSS/BLUE SHIELD | Admitting: Family Medicine

## 2016-03-11 ENCOUNTER — Ambulatory Visit (INDEPENDENT_AMBULATORY_CARE_PROVIDER_SITE_OTHER): Payer: 59 | Admitting: Family Medicine

## 2016-03-11 ENCOUNTER — Encounter: Payer: Self-pay | Admitting: Family Medicine

## 2016-03-11 VITALS — BP 130/79 | HR 78 | Wt 168.0 lb

## 2016-03-11 DIAGNOSIS — F411 Generalized anxiety disorder: Secondary | ICD-10-CM | POA: Diagnosis not present

## 2016-03-11 DIAGNOSIS — F410 Panic disorder [episodic paroxysmal anxiety] without agoraphobia: Secondary | ICD-10-CM | POA: Diagnosis not present

## 2016-03-11 DIAGNOSIS — Z23 Encounter for immunization: Secondary | ICD-10-CM

## 2016-03-11 MED ORDER — FLUOXETINE HCL 40 MG PO CAPS: 40.0000 mg | ORAL_CAPSULE | Freq: Every day | ORAL | 1 refills | Status: DC

## 2016-03-11 MED ORDER — FLUOXETINE HCL 40 MG PO CAPS
40.0000 mg | ORAL_CAPSULE | Freq: Every day | ORAL | 1 refills | Status: DC
Start: 2016-03-11 — End: 2016-07-12

## 2016-03-11 NOTE — Progress Notes (Signed)
   Subjective:    Patient ID: Rita Graves, female    DOB: 09-Feb-1996, 20 y.o.   MRN: [id number]  HPI 3 mo f/u anxiety - She also has a history of panic attacks and uses alprazolam as needed. She says she hasn't had these alprazolam and months. He is currently on fluoxetine 40 mg daily. She is happy with her current regimen. Thus far her only side effect is low libido. She does still complain of feeling nervous and on edge several days a week and feeling like she worries too much. She rates her symptoms is somewhat difficult. She is Still looking for a job and so this is been somewhat stressful.   Review of Systems     Objective:   Physical Exam  Constitutional: She is oriented to person, place, and time. She appears well-developed and well-nourished.  HENT:  Head: Normocephalic and atraumatic.  Cardiovascular: Normal rate, regular rhythm and normal heart sounds.   Pulmonary/Chest: Effort normal and breath sounds normal.  Neurological: She is alert and oriented to person, place, and time.  Skin: Skin is warm and dry.  Psychiatric: She has a normal mood and affect. Her behavior is normal.       Assessment & Plan:  GAD/Panic - GAD 7 score is down to 10. Consistent with moderate anxiety. Though she reports that she is happy with the 40 mg dose and doesn't want to adjusted at this point in time.Into new current regimen will send over 90 day supply to the pharmacy.  She'll be able to find a new job soon. In this really elevate her mood

## 2016-04-21 ENCOUNTER — Other Ambulatory Visit: Payer: Self-pay

## 2016-04-21 MED ORDER — NORGESTIMATE-ETH ESTRADIOL 0.25-35 MG-MCG PO TABS: 1.0000 | ORAL_TABLET | Freq: Every day | ORAL | 11 refills | Status: DC

## 2016-05-18 IMAGING — CR DG LUMBAR SPINE COMPLETE 4+V
5 series · 5 of 5 positions shown · non-contrast
Comparison: None.

CLINICAL DATA: Initial evaluation for back pain since this [REDACTED].
Pain radiates into lakes sometimes.

EXAM:
LUMBAR SPINE - COMPLETE 4+ VIEW

[view not recorded (1 of 5)]
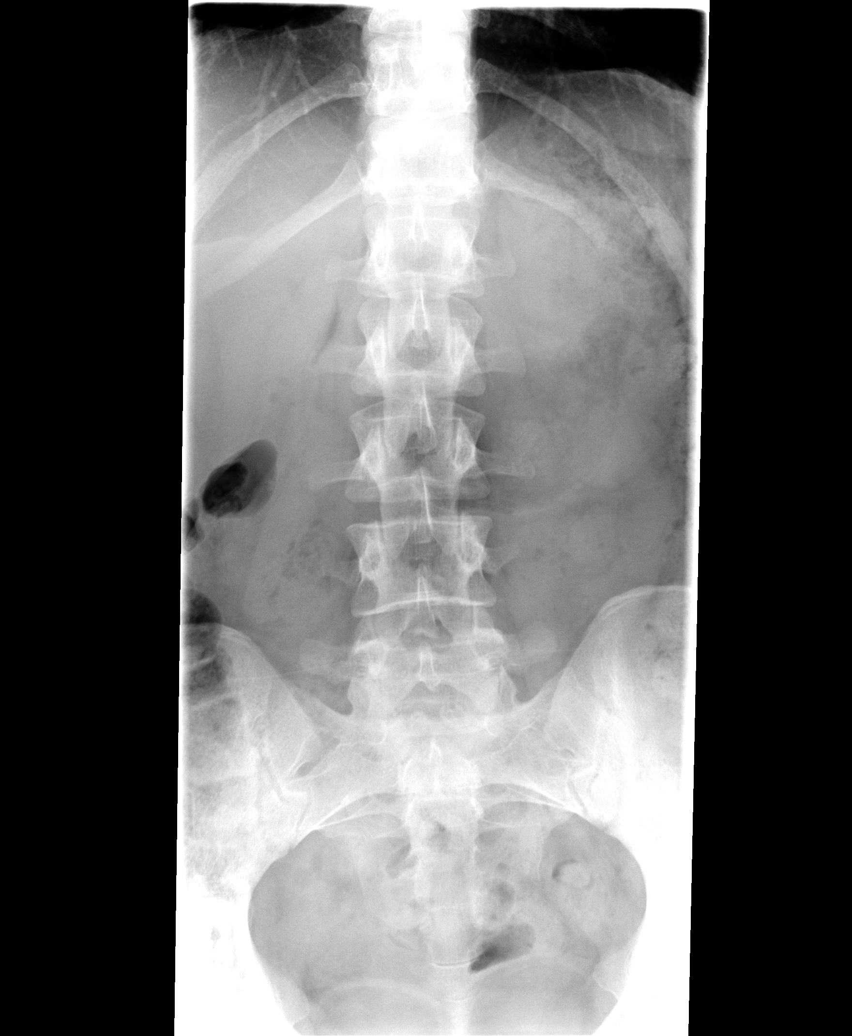

[view not recorded (2 of 5)]
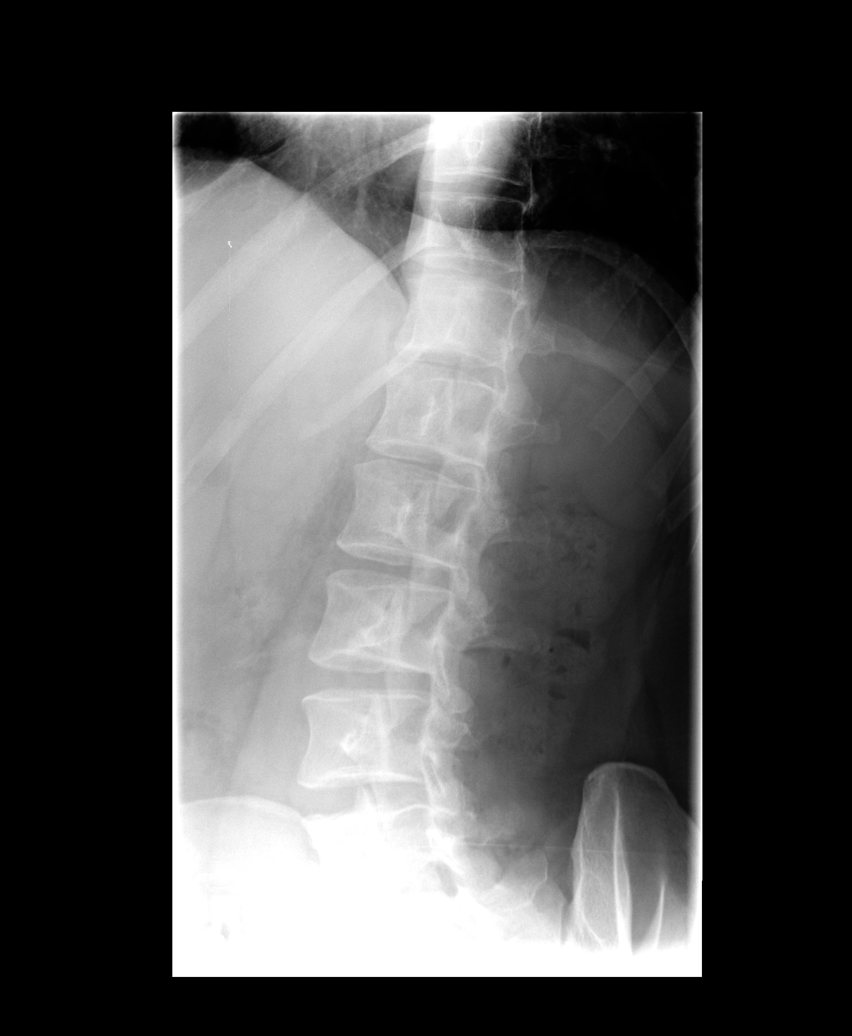

[view not recorded (3 of 5)]
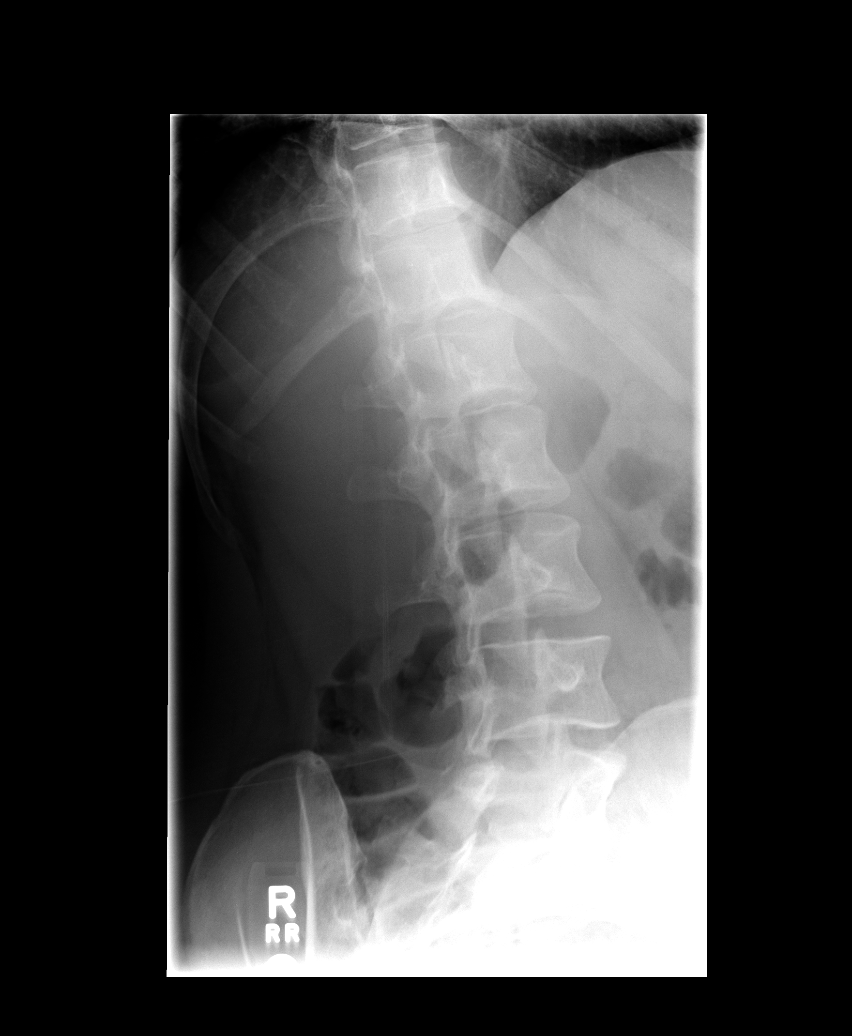

[view not recorded (4 of 5)]
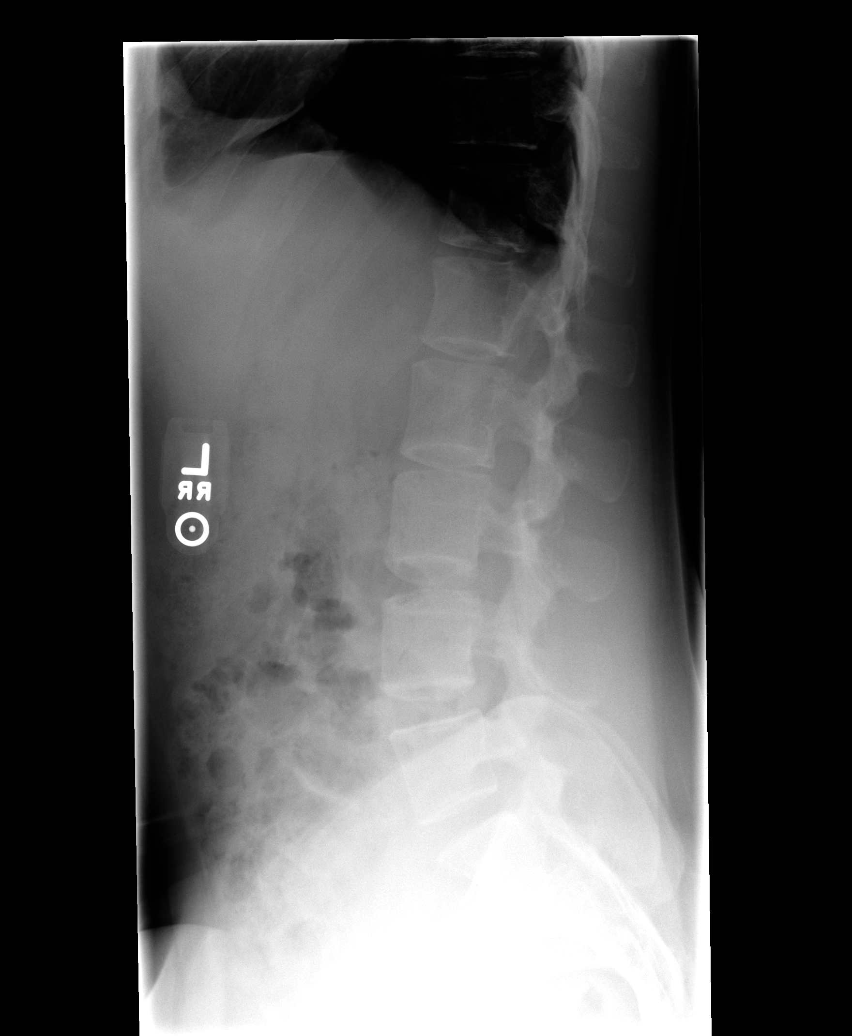

[view not recorded (5 of 5)]
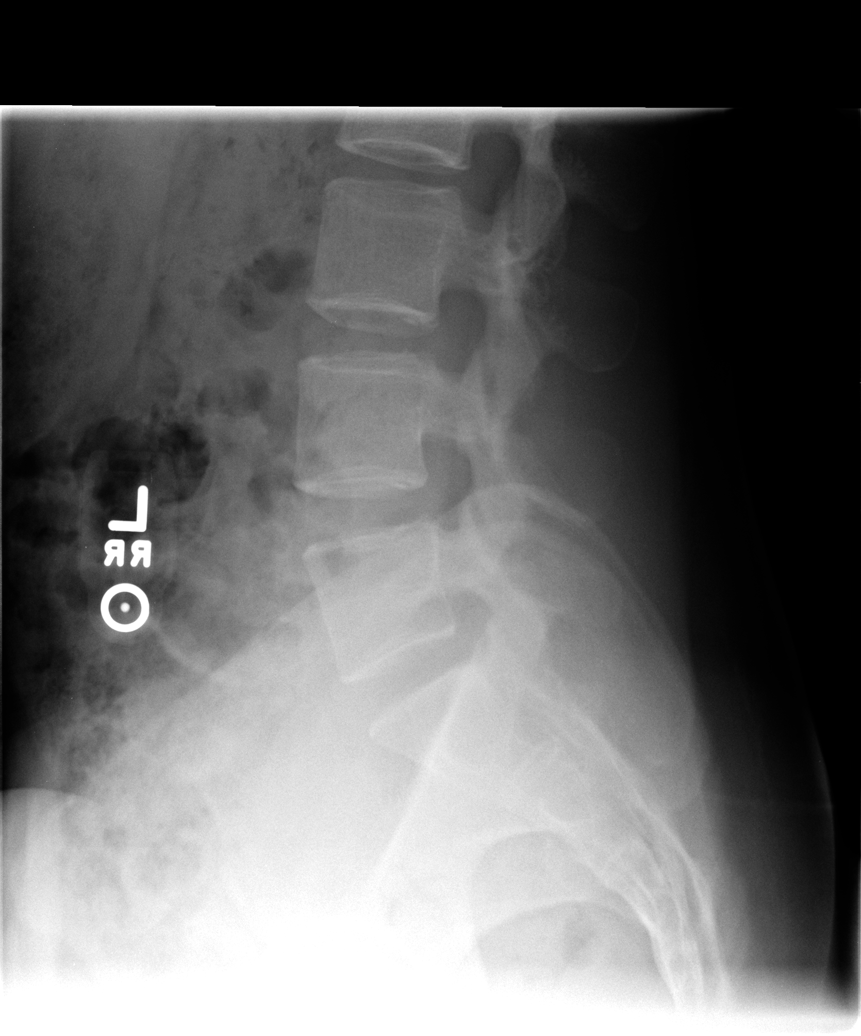

[5 of 5 positions shown; findings below may reference images not displayed]

FINDINGS: Mild dextroscoliosis possibly positional. Mild L5-S1 facet
sclerosis. No significant degenerative change otherwise. No fracture
or focal bony abnormality.
IMPRESSION: Mild L5-S1 facet sclerosis.

## 2016-07-12 ENCOUNTER — Ambulatory Visit (INDEPENDENT_AMBULATORY_CARE_PROVIDER_SITE_OTHER): Payer: 59 | Admitting: Family Medicine

## 2016-07-12 VITALS — BP 123/83 | HR 88 | Wt 171.0 lb

## 2016-07-12 DIAGNOSIS — F411 Generalized anxiety disorder: Secondary | ICD-10-CM | POA: Diagnosis not present

## 2016-07-12 MED ORDER — ALPRAZOLAM 0.5 MG PO TABS: 0.5000 mg | ORAL_TABLET | Freq: Two times a day (BID) | ORAL | 0 refills | Status: DC | PRN

## 2016-07-12 NOTE — Progress Notes (Signed)
   Subjective:    Patient ID: Rita Graves, female    DOB: 21-Aug-1995, 21 y.o.   MRN: [id number]  HPI Here today to follow-up on anxiety. He has no longer on the fluoxetine. Her last GAD 7 score was 10.  She reports that she is doing well overall. She has a new job which she loves and says that has really significantly reduced her stress levels. She actually came off of the fluoxetine about 6 weeks ago. She said she just didn't refill it because she can't continue to go to the pharmacy. And then she felt like at the time she was doing so much better she wanted to see how she did without it. Overall she does feel like the fluoxetine really helped her mood but it did cause a decreased sex drive and some mild increased irritability. They she says it did work better than the Zoloft.   Review of Systems     Objective:   Physical Exam  Constitutional: She is oriented to person, place, and time. She appears well-developed and well-nourished.  HENT:  Head: Normocephalic and atraumatic.  Cardiovascular: Normal rate, regular rhythm and normal heart sounds.   Pulmonary/Chest: Effort normal and breath sounds normal.  Neurological: She is alert and oriented to person, place, and time.  Skin: Skin is warm and dry.  Psychiatric: She has a normal mood and affect. Her behavior is normal.          Assessment & Plan:  Generalized anxiety disorder-GAD 7 score of 8 today. Discussed options. She wants to hold off on medications. We certainly discuss if she decides to go back on something we could try another SSRI besides Prozac. She might be a good candidate for Lexapro. Call if any problems. I did go ahead and refill her alprazolam for as needed use. 30 tabs usually lasts her close to a year.  Time spent 15 minutes, greater than 50% of the time spent counseling about anxiety.

## 2016-08-07 ENCOUNTER — Encounter: Payer: Self-pay | Admitting: Family Medicine

## 2016-08-12 IMAGING — US US PELVIS COMPLETE
1 series · 13 of 25 positions shown · non-contrast
Comparison: None

CLINICAL DATA: Cramping for 3 weeks, pain [DATE], had IUD placed
05/08/2015, began menstrual cycle on 07/10/2015, IUD complication,
initial encounter

EXAM:
TRANSABDOMINAL AND TRANSVAGINAL ULTRASOUND OF PELVIS
TECHNIQUE: Both transabdominal and transvaginal ultrasound examinations of the
pelvis were performed. Transabdominal technique was performed for
global imaging of the pelvis including uterus, ovaries, adnexal
regions, and pelvic cul-de-sac. It was necessary to proceed with
endovaginal exam following the transabdominal exam to visualize the
endometrium and adnexa.

[Series 1: us pelvis complete · 0.21mm/px · 13 of 48 slices shown]
[im 1/48]
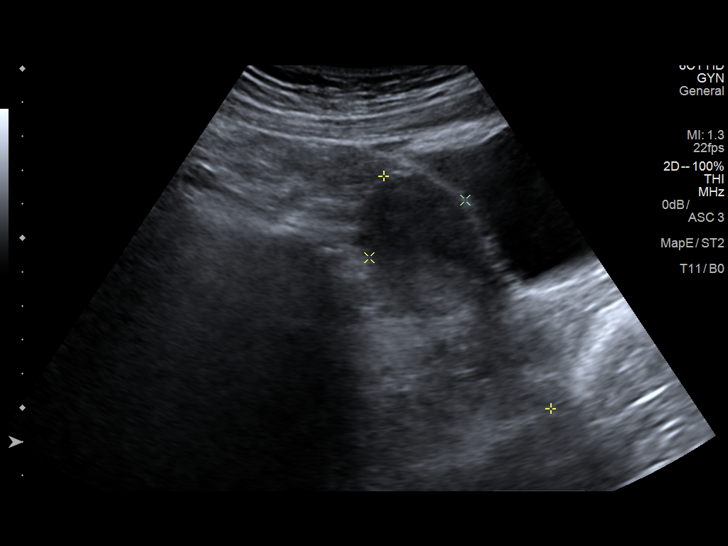
[im 4/48]
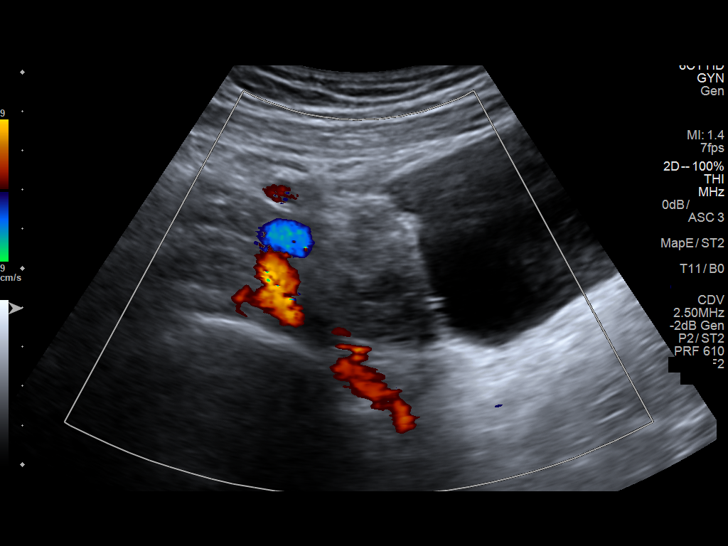
[im 8/48]
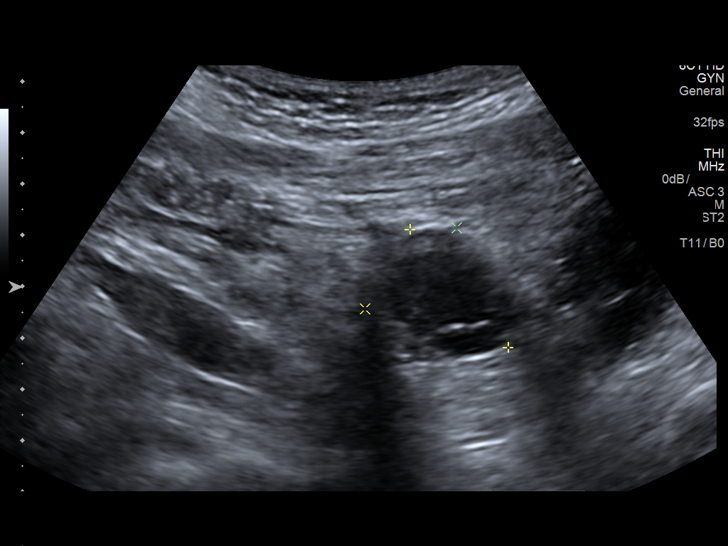
[im 12/48]
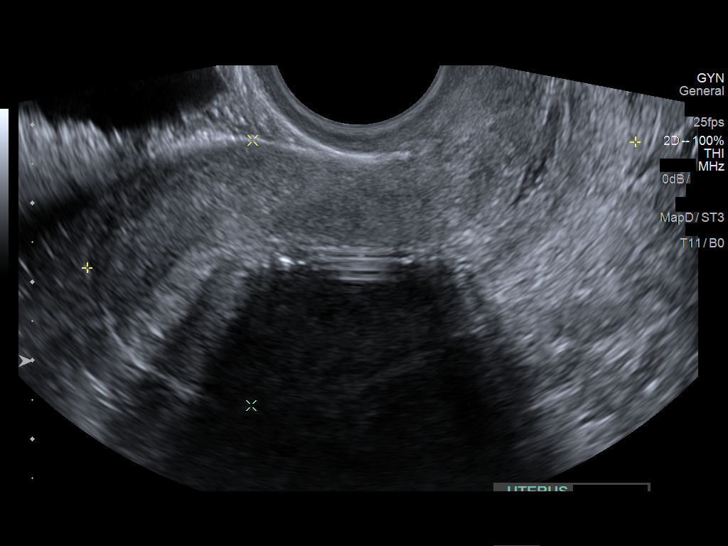
[im 16/48]
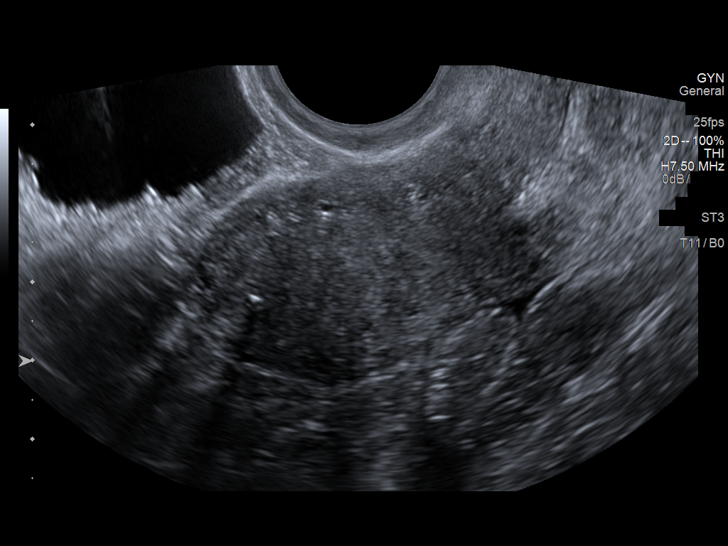
[im 20/48]
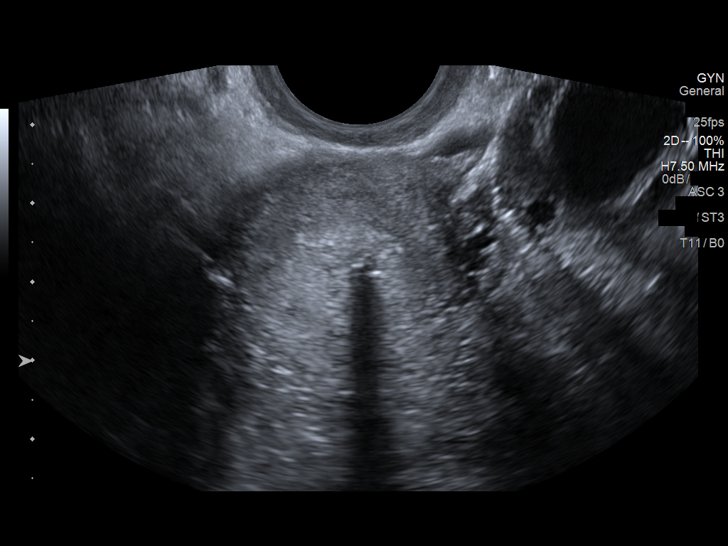
[im 24/48]
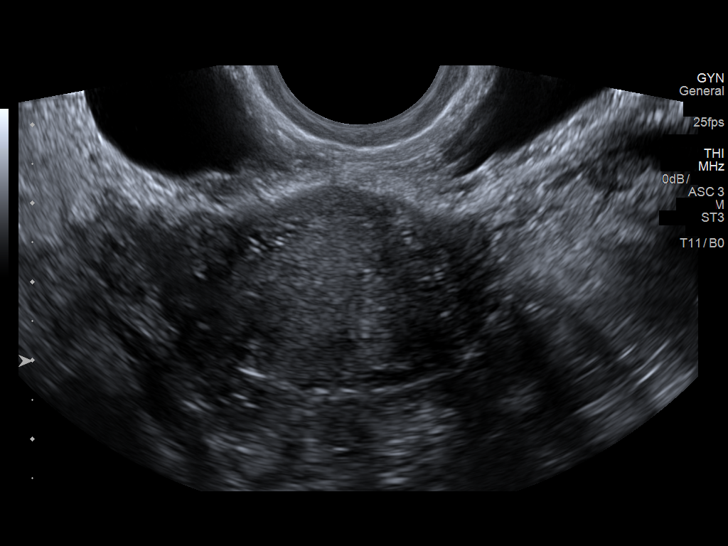
[im 28/48]
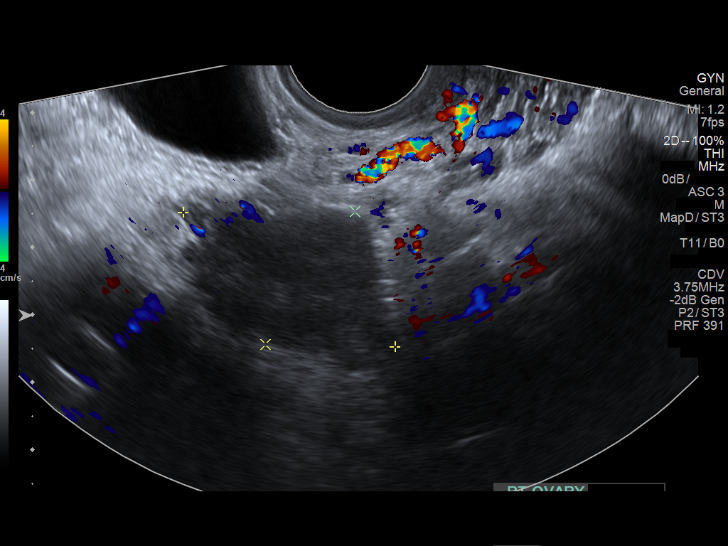
[im 32/48]
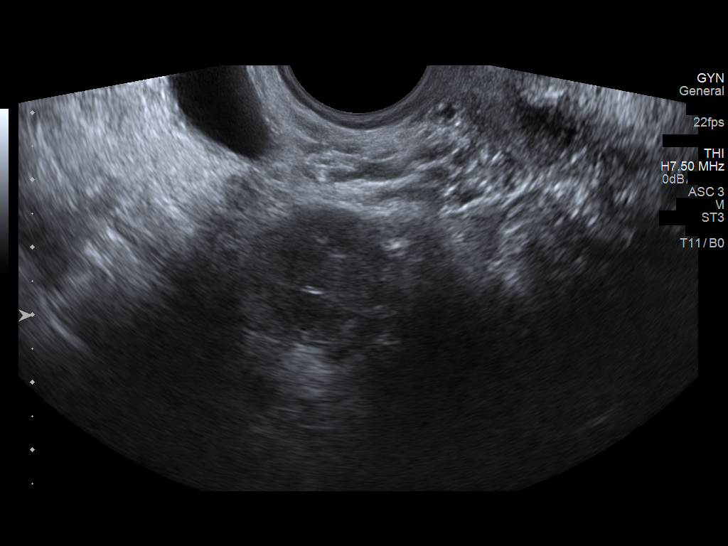
[im 36/48]
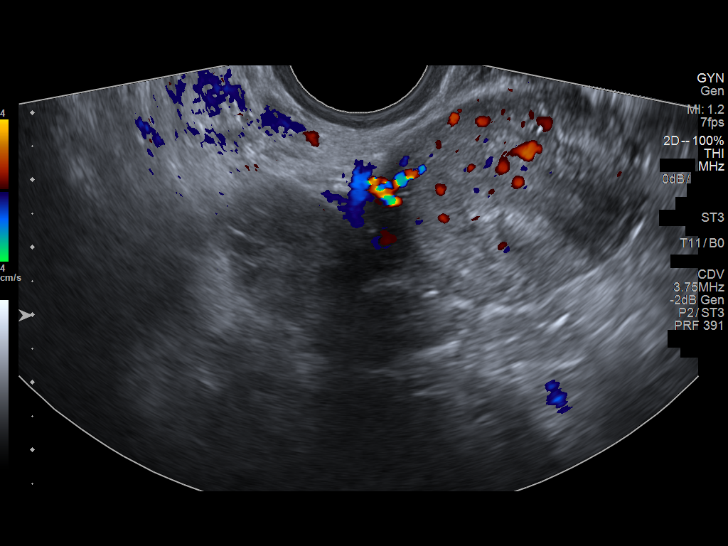
[im 40/48]
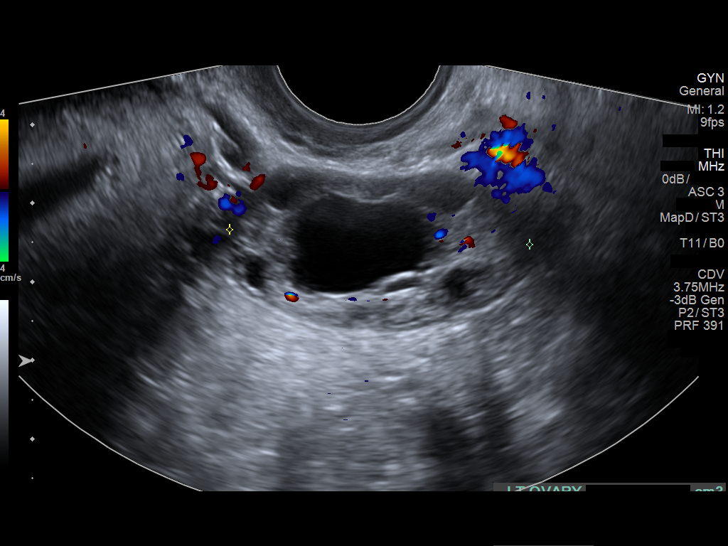
[im 44/48]
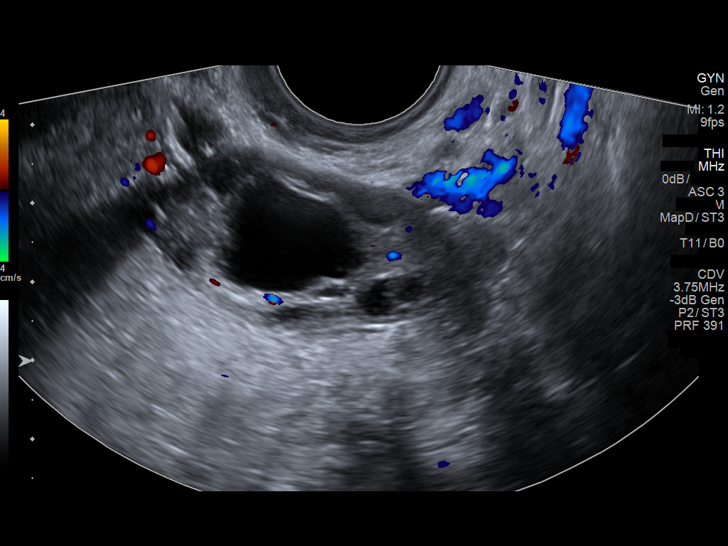
[im 48/48]
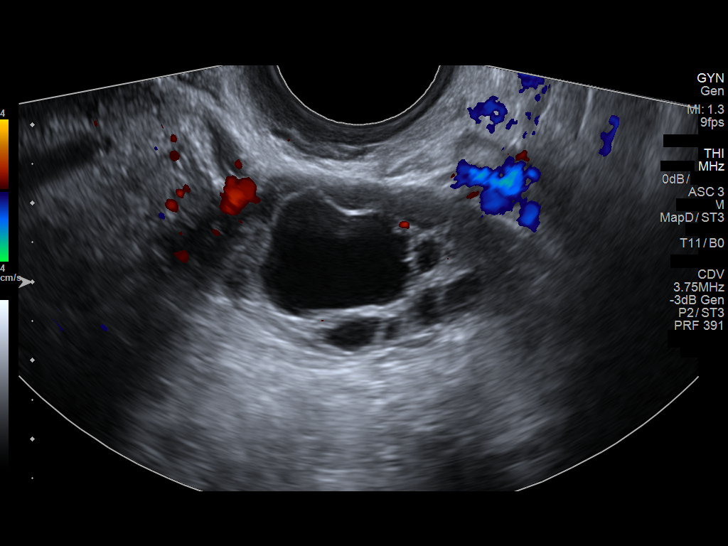

[13 of 25 positions shown; findings below may reference images not displayed]

FINDINGS: Uterus

Measurements: 7.2 x 3.4 x 3.6 cm. Normal morphology without mass

Endometrium

Thickness: 5 mm thick, normal. IUD noted in expected position within
the mid to upper uterine segments. No endometrial fluid.

Right ovary

Measurements: 3.7 x 2.4 x 2.1 cm. Normal morphology without mass.
Internal blood flow present on color Doppler imaging.

Left ovary

Measurements: 3.3 x 2.3 x 3.8 cm. Dominant follicle cyst. No
evidence of mass. Internal blood flow present on color Doppler
imaging.

Other findings

No free fluid or adnexal masses.
IMPRESSION: IUD within expected position within uterus.

Otherwise normal exam.

## 2016-08-24 ENCOUNTER — Encounter: Payer: Self-pay | Admitting: Family Medicine

## 2016-09-09 ENCOUNTER — Telehealth: Payer: Self-pay

## 2016-09-09 NOTE — Telephone Encounter (Signed)
Pt will need a referral for her breast US.

## 2016-09-12 NOTE — Telephone Encounter (Signed)
OK to place order for bilat breast US>

## 2016-09-12 NOTE — Telephone Encounter (Signed)
[place] ordered and faxed to [facility] per Pt request.

## 2016-09-21 LAB — HM MAMMOGRAPHY

## 2016-09-26 ENCOUNTER — Encounter: Payer: Self-pay | Admitting: Family Medicine

## 2016-10-12 ENCOUNTER — Encounter: Payer: Self-pay | Admitting: Family Medicine

## 2017-02-02 ENCOUNTER — Encounter: Payer: Self-pay | Admitting: Family Medicine

## 2017-02-10 ENCOUNTER — Ambulatory Visit (INDEPENDENT_AMBULATORY_CARE_PROVIDER_SITE_OTHER): Payer: 59 | Admitting: Physician Assistant

## 2017-02-10 VITALS — BP 137/85 | HR 84 | Wt 168.0 lb

## 2017-02-10 DIAGNOSIS — G43101 Migraine with aura, not intractable, with status migrainosus: Secondary | ICD-10-CM | POA: Diagnosis not present

## 2017-02-10 DIAGNOSIS — R6882 Decreased libido: Secondary | ICD-10-CM | POA: Diagnosis not present

## 2017-02-10 DIAGNOSIS — F411 Generalized anxiety disorder: Secondary | ICD-10-CM

## 2017-02-10 DIAGNOSIS — Z8349 Family history of other endocrine, nutritional and metabolic diseases: Secondary | ICD-10-CM

## 2017-02-10 DIAGNOSIS — R5383 Other fatigue: Secondary | ICD-10-CM

## 2017-02-10 DIAGNOSIS — R002 Palpitations: Secondary | ICD-10-CM | POA: Diagnosis not present

## 2017-02-10 DIAGNOSIS — F410 Panic disorder [episodic paroxysmal anxiety] without agoraphobia: Secondary | ICD-10-CM

## 2017-02-10 MED ORDER — RIZATRIPTAN BENZOATE 10 MG PO TABS: 10.0000 mg | ORAL_TABLET | ORAL | 0 refills | Status: DC | PRN

## 2017-02-10 MED ORDER — ALPRAZOLAM 0.5 MG PO TABS
0.5000 mg | ORAL_TABLET | Freq: Two times a day (BID) | ORAL | 0 refills | Status: DC | PRN
Start: 2017-02-10 — End: 2017-03-31

## 2017-02-10 NOTE — Patient Instructions (Addendum)
Premature Ventricular Contraction A premature ventricular contraction (PVC) is a common irregularity in the normal heart rhythm. These contractions are extra heartbeats that start in the heart ventricles and occur too early in the normal sequence. During the PVC, the heart's normal electrical pathway is not used, so the beat is shorter and less effective. In most cases, these contractions come and go and do not require treatment. What are the causes? In many cases, the cause may not be known. Common causes of the condition include:  Smoking.  Drinking alcohol.  Caffeine.  Certain medicines.  Some illegal drugs.  Stress.  Certain medical conditions can also cause PVCs:  Changes in minerals in the blood (electrolytes).  Heart failure.  Heart valve problems.  Low blood oxygen levels or high carbon dioxide levels.  Heart attack, or coronary artery disease.  What are the signs or symptoms? The main symptom of this condition is a fast or skipped heartbeat (palpitations). Other symptoms include:  Chest pain.  Shortness of breath.  Feeling tired.  Dizziness.  In some cases, there are no symptoms. How is this diagnosed? This condition may be diagnosed based on:  Your medical history.  A physical exam. During the exam, the health care provider will check for irregular heartbeats.  Tests, such as: ? An ECG (electrocardiogram) to monitor the electrical activity of your heart. ? Holter monitor testing. This involves wearing a device that clips to your clothing and monitors the electrical activity of your heart over longer periods of time. ? Stress tests to see how exercise affects your heart rhythm and blood supply. ? Echocardiogram. This test uses sound waves (ultrasound) to produce an image of your heart. ? Electrophysiology study. This test checks the electric pathways in your heart.  How is this treated? Treatment depends on any underlying conditions, the type of PVCs  that you are having, and how much the symptoms are interfering with your daily life. Possible treatments include:  Avoiding things that can trigger the premature contractions, such as caffeine or alcohol.  Medicines. These may be given if symptoms are severe or if the extra heartbeats are frequent.  Treatment for any underlying condition that is found to be the cause of the contractions.  Catheter ablation. This procedure destroys the heart tissues that send abnormal signals.  In some cases, no treatment is required. Follow these instructions at home: Lifestyle Follow these instructions as told by your health care provider:  Do not use any products that contain nicotine or tobacco, such as cigarettes and e-cigarettes. If you need help quitting, ask your health care provider.  If caffeine triggers episodes of PVC, do not eat, drink, or use anything with caffeine in it.  If caffeine does not seem to trigger episodes, consume caffeine in moderation.  If alcohol triggers episodes of PVC, do not drink alcohol.  If alcohol does not seem to trigger episodes, limit alcohol intake to no more than 1 drink a day for nonpregnant women and 2 drinks a day for men. One drink equals 12 oz of beer, 5 oz of wine, or 1 oz of hard liquor.  Exercise regularly. Ask your health care provider what type of exercise is safe for you.  Find healthy ways to manage stress. Avoid stressful situations when possible.  Try to get at least 7-9 hours of sleep each night, or as much as recommended by your health care provider.  Do not use illegal drugs.  General instructions  Take over-the-counter and prescription medicines only   as told by your health care provider.  Keep all follow-up visits as told by your health care provider. This is important. Get help right away if:  You feel palpitations that are frequent or continual.  You have chest pain.  You have shortness of breath.  You have sweating for no  reason.  You have nausea and vomiting.  You become light-headed or you faint. This information is not intended to replace advice given to you by your health care provider. Make sure you discuss any questions you have with your health care provider. Document Released: 01/08/2004 Document Revised: 01/15/2016 Document Reviewed: 10/28/2015 Elsevier Interactive Patient Education  2018 [employer].   Migraine Headache A migraine headache is a very strong throbbing pain on one side or both sides of your head. Migraines can also cause other symptoms. Talk with your doctor about what things may bring on (trigger) your migraine headaches. Follow these instructions at home: Medicines  Take over-the-counter and prescription medicines only as told by your doctor.  Do not drive or use heavy machinery while taking prescription pain medicine.  To prevent or treat constipation while you are taking prescription pain medicine, your doctor may recommend that you: ? Drink enough fluid to keep your pee (urine) clear or pale yellow. ? Take over-the-counter or prescription medicines. ? Eat foods that are high in fiber. These include fresh fruits and vegetables, whole grains, and beans. ? Limit foods that are high in fat and processed sugars. These include fried and sweet foods. Lifestyle  Avoid alcohol.  Do not use any products that contain nicotine or tobacco, such as cigarettes and e-cigarettes. If you need help quitting, ask your doctor.  Get at least 8 hours of sleep every night.  Limit your stress. General instructions   Keep a journal to find out what may bring on your migraines. For example, write down: ? What you eat and drink. ? How much sleep you get. ? Any change in what you eat or drink. ? Any change in your medicines.  If you have a migraine: ? Avoid things that make your symptoms worse, such as bright lights. ? It may help to lie down in a dark, quiet room. ? Do not drive or use  heavy machinery. ? Ask your doctor what activities are safe for you.  Keep all follow-up visits as told by your doctor. This is important. Contact a doctor if:  You get a migraine that is different or worse than your usual migraines. Get help right away if:  Your migraine gets very bad.  You have a fever.  You have a stiff neck.  You have trouble seeing.  Your muscles feel weak or like you cannot control them.  You start to lose your balance a lot.  You start to have trouble walking.  You pass out (faint). This information is not intended to replace advice given to you by your health care provider. Make sure you discuss any questions you have with your health care provider. Document Released: 03/01/2008 Document Revised: 12/11/2015 Document Reviewed: 11/09/2015 Elsevier Interactive Patient Education  2017 [employer].

## 2017-02-10 NOTE — Progress Notes (Signed)
Subjective:    Patient ID: Rita Graves, female    DOB: 1996-04-19, 21 y.o.   MRN: [id number]  HPI  Pt is a 21 yo female who presents to the clinic for follow up.   Migraines she is having once a month. excedrin seems to make worse. She does not have a rescue.she is alreay on OCP.    GAD/panic attacks- she only uses as needed. She is aware of abuse potential. She is not having very many panic attacks.   She is having some palpitations for last 6 months. When they occur she notices she starts to cough. Over the last few months been occurring regularly almost every day but not always. She denies any new medication, illegal drug use, alcohol use, sudafed. No family hx of arrhthymias.  Pt is also concerned with low libido. She can orgasm. She just never has the desire too. She is on OCP.    .. Active Ambulatory Problems    Diagnosis Date Noted  . GAD (generalized anxiety disorder) 05/06/2013  . Panic attack 07/12/2013  . Bilateral low back pain without sciatica 08/14/2014  . Upper back pain 08/14/2014  . PMDD (premenstrual dysphoric disorder) 08/14/2014  . Migraine with aura and with status migrainosus, not intractable 07/20/2015  . Former smoker 01/04/2016   Resolved Ambulatory Problems    Diagnosis Date Noted  . Vaginal bleeding 12/30/2014  . Abdominal cramping 07/15/2015  . IUD complication, initial encounter 07/15/2015  . Smoker 07/20/2015   Past Medical History:  Diagnosis Date  . Anxiety   . Migraine    .Marland Kitchen Family History  Problem Relation Age of Onset  . Diabetes Father   . Migraines Father   . Hypertension Unknown   . Thyroid disease Unknown   . Asthma Unknown      .      Review of Systems  All other systems reviewed and are negative.      Objective:   Physical Exam  Constitutional: She is oriented to person, place, and time. She appears well-developed and well-nourished.  HENT:  Head: Normocephalic and atraumatic.  Cardiovascular: Normal rate,  regular rhythm and normal heart sounds.   Pulmonary/Chest: Effort normal and breath sounds normal.  Neurological: She is alert and oriented to person, place, and time.  Psychiatric: She has a normal mood and affect. Her behavior is normal.          Assessment & Plan:  Marland KitchenMarland KitchenJoann was seen today for palpitations.  Diagnoses and all orders for this visit:  Palpitations -     TSH -     CBC with Differential/Platelet -     Ferritin -     BASIC METABOLIC PANEL WITH GFR  Family history of thyroid disease -     TSH  No energy -     TSH -     CBC with Differential/Platelet -     Ferritin -     BASIC METABOLIC PANEL WITH GFR  Low libido -     Testosterone  Other orders -     ALPRAZolam (XANAX) 0.5 MG tablet; Take 1 tablet (0.5 mg total) by mouth 2 (two) times daily as needed. for anxiety -     rizatriptan (MAXALT) 10 MG tablet; Take 1 tablet (10 mg total) by mouth as needed for migraine. May repeat in 2 hours if needed  .Marland Kitchen Depression screen PHQ 2/9 02/10/2017  Decreased Interest 0  Down, Depressed, Hopeless 0  PHQ - 2 Score 0  Since palpitations not occurring daily. Don't want to get holter monitor yet.  HO given for patient to look at for triggers.  Watch caffeine.  Labs ordered to evaluate for a metabolic cause.  EKG- NSR with sinus arrhythmia. No PVC's seen.  Follow up in 4 weeks.   maxalt given for rescue to try.   Xanax as needed given for panic attacks.   Will check testosterone for low libido.

## 2017-02-11 LAB — CBC WITH DIFFERENTIAL/PLATELET
BASOS PCT: 0.4 %
Basophils Absolute: 36 cells/uL (ref 0–200)
Eosinophils Absolute: 200 cells/uL (ref 15–500)
Eosinophils Relative: 2.2 %
HCT: 39.5 % (ref 35.0–45.0)
HEMOGLOBIN: 13.3 g/dL (ref 11.7–15.5)
LYMPHS ABS: 3039 {cells}/uL (ref 850–3900)
MCH: 30.3 pg (ref 27.0–33.0)
MCHC: 33.7 g/dL (ref 32.0–36.0)
MCV: 90 fL (ref 80.0–100.0)
MPV: 9.9 fL (ref 7.5–12.5)
Monocytes Relative: 9.3 %
NEUTROS ABS: 4978 {cells}/uL (ref 1500–7800)
Neutrophils Relative %: 54.7 %
PLATELETS: 322 10*3/uL (ref 140–400)
RBC: 4.39 10*6/uL (ref 3.80–5.10)
RDW: 11.9 % (ref 11.0–15.0)
TOTAL LYMPHOCYTE: 33.4 %
WBC: 9.1 10*3/uL (ref 3.8–10.8)
WBCMIX: 846 {cells}/uL (ref 200–950)

## 2017-02-11 LAB — TSH: TSH: 2.4 mIU/L

## 2017-02-11 LAB — BASIC METABOLIC PANEL WITH GFR
BUN: 10 mg/dL (ref 7–25)
CHLORIDE: 105 mmol/L (ref 98–110)
CO2: 28 mmol/L (ref 20–32)
Calcium: 9.6 mg/dL (ref 8.6–10.2)
Creat: 0.67 mg/dL (ref 0.50–1.10)
GFR, EST AFRICAN AMERICAN: 146 mL/min/{1.73_m2} (ref >=60)
GFR, EST NON AFRICAN AMERICAN: 126 mL/min/{1.73_m2} (ref >=60)
Glucose, Bld: 88 mg/dL (ref 65–99)
POTASSIUM: 4.1 mmol/L (ref 3.5–5.3)
Sodium: 139 mmol/L (ref 135–146)

## 2017-02-11 LAB — FERRITIN: Ferritin: 52 ng/mL (ref 10–154)

## 2017-02-12 ENCOUNTER — Encounter: Payer: Self-pay | Admitting: Physician Assistant

## 2017-02-13 ENCOUNTER — Encounter: Payer: Self-pay | Admitting: Physician Assistant

## 2017-02-14 LAB — TESTOSTERONE, TOTAL, LC/MS/MS: Testosterone, Total, LC-MS-MS: 23 ng/dL (ref 2–45)

## 2017-02-15 NOTE — Addendum Note (Signed)
Addended by: Narda Rutherford on: 02/15/2017 01:22 PM   Modules accepted: Orders

## 2017-03-15 ENCOUNTER — Encounter: Payer: Self-pay | Admitting: Family Medicine

## 2017-03-31 ENCOUNTER — Ambulatory Visit (INDEPENDENT_AMBULATORY_CARE_PROVIDER_SITE_OTHER): Payer: 59 | Admitting: Family Medicine

## 2017-03-31 ENCOUNTER — Encounter: Payer: Self-pay | Admitting: Family Medicine

## 2017-03-31 VITALS — BP 134/73 | HR 87 | Ht 63.0 in | Wt 169.0 lb

## 2017-03-31 DIAGNOSIS — Z111 Encounter for screening for respiratory tuberculosis: Secondary | ICD-10-CM | POA: Diagnosis not present

## 2017-03-31 DIAGNOSIS — Z113 Encounter for screening for infections with a predominantly sexual mode of transmission: Secondary | ICD-10-CM | POA: Diagnosis not present

## 2017-03-31 DIAGNOSIS — Z23 Encounter for immunization: Secondary | ICD-10-CM | POA: Diagnosis not present

## 2017-03-31 DIAGNOSIS — Z0000 Encounter for general adult medical examination without abnormal findings: Secondary | ICD-10-CM | POA: Diagnosis not present

## 2017-03-31 DIAGNOSIS — G43101 Migraine with aura, not intractable, with status migrainosus: Secondary | ICD-10-CM | POA: Diagnosis not present

## 2017-03-31 MED ORDER — RIZATRIPTAN BENZOATE 10 MG PO TABS: 10.0000 mg | ORAL_TABLET | Freq: Every day | ORAL | 1 refills | Status: AC | PRN

## 2017-03-31 NOTE — Patient Instructions (Addendum)

## 2017-03-31 NOTE — Progress Notes (Signed)
Subjective:     Rita Graves is a 21 y.o. female and is here for a comprehensive physical exam. The patient reports no problems.  She is not currently exercising.  Social History   Social History  . Marital status: Single    Spouse name: N/A  . Number of children: N/A  . Years of education: N/A   Occupational History  . Student    Social History Main Topics  . Smoking status: Former Smoker    Packs/day: 0.50  . Smokeless tobacco: Never Used  . Alcohol use No  . Drug use: No  . Sexual activity: Yes    Partners: Male    Birth control/ protection: IUD   Other Topics Concern  . 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     Health Maintenance  Topic Date Due  . CHLAMYDIA SCREENING  01/09/2016  . INFLUENZA VACCINE  01/04/2017  . MAMMOGRAM  09/21/2017  . PAP SMEAR  01/20/2019  . TETANUS/TDAP  02/04/2026  . HIV Screening  Completed    The following portions of the patient's history were reviewed and updated as appropriate: allergies, current medications, past family history, past medical history, past social history, past surgical history and problem list.  Review of Systems A comprehensive review of systems was negative.   Objective:    There were no vitals taken for this visit. General appearance: alert, cooperative and appears stated age Head: Normocephalic, without obvious abnormality, atraumatic Eyes: conj clear, EOMI, PEERLA Ears: normal TM's and external ear canals both ears Nose: Nares normal. Septum midline. Mucosa normal. No drainage or sinus tenderness. Throat: lips, mucosa, and tongue normal; teeth and gums normal Neck: no adenopathy, no carotid bruit, no JVD, supple, symmetrical, trachea midline and thyroid not enlarged, symmetric, no tenderness/mass/nodules Back: symmetric, no curvature. ROM normal. No CVA tenderness. Lungs: clear to auscultation bilaterally Breasts: normal appearance, no masses or tenderness Heart: regular rate  and rhythm, S1, S2 normal, no murmur, click, rub or gallop Abdomen: soft, non-tender; bowel sounds normal; no masses,  no organomegaly Pelvic: deferred Extremities: extremities normal, atraumatic, no cyanosis or edema Pulses: 2+ and symmetric Skin: Skin color, texture, turgor normal. No rashes or lesions Lymph nodes: Cervical, supraclavicular, and axillary nodes normal. Neurologic: Alert and oriented X 3, normal strength and tone. Normal symmetric reflexes. Normal coordination and gait    Assessment:    Healthy female exam.      Plan:     See After Visit Summary for Counseling Recommendations   Keep up a regular exercise program and make sure you are eating a healthy diet Try to eat 4 servings of dairy a day, or if you are lactose intolerant take a calcium with vitamin D daily.  Your vaccines are up to date.  Due for urine GC/chlamy Pap smear is up-to-date. Flu vaccine given today.   Migraine headaches-the Maxalt seems to be working really well.  She is quite would like to have a refill on that.  Palpitations-stable.

## 2017-04-02 ENCOUNTER — Emergency Department
Admission: EM | Admit: 2017-04-02 | Discharge: 2017-04-02 | Disposition: A | Discharge status: Home / Self Care | Payer: 59 | Source: Home / Self Care

## 2017-04-02 ENCOUNTER — Encounter: Payer: Self-pay | Admitting: Emergency Medicine

## 2017-04-02 NOTE — Discharge Instructions (Signed)
Negative PPD Screen- 0 mm

## 2017-04-02 NOTE — ED Triage Notes (Signed)
Patient presents today for PPD Reading  28mm- Negative

## 2017-04-03 LAB — C. TRACHOMATIS/N. GONORRHOEAE RNA
C. trachomatis RNA, TMA: NOT DETECTED
N. gonorrhoeae RNA, TMA: NOT DETECTED

## 2017-04-03 LAB — GC/CHLAMYDIA PROBE, AMP (THROAT)

## 2017-04-05 ENCOUNTER — Encounter: Payer: Self-pay | Admitting: *Deleted
